# Patient Record
Sex: Male | Born: 1940 | Race: White | Marital: Married | Smoking: Former smoker
Health system: Southern US, Community
[De-identification: ages and names within clinical notes are randomized; demographics above are authoritative.]

## PROBLEM LIST (undated history)

## (undated) DIAGNOSIS — F329 Major depressive disorder, single episode, unspecified: Secondary | ICD-10-CM

## (undated) DIAGNOSIS — N183 Chronic kidney disease, stage 3 (moderate): Secondary | ICD-10-CM

## (undated) DIAGNOSIS — M5136 Other intervertebral disc degeneration, lumbar region: Secondary | ICD-10-CM

## (undated) DIAGNOSIS — Z794 Long term (current) use of insulin: Secondary | ICD-10-CM

## (undated) DIAGNOSIS — E11319 Type 2 diabetes mellitus with unspecified diabetic retinopathy without macular edema: Secondary | ICD-10-CM

## (undated) DIAGNOSIS — G4730 Sleep apnea, unspecified: Secondary | ICD-10-CM

## (undated) DIAGNOSIS — G2581 Restless legs syndrome: Secondary | ICD-10-CM

## (undated) DIAGNOSIS — I252 Old myocardial infarction: Secondary | ICD-10-CM

## (undated) DIAGNOSIS — N179 Acute kidney failure, unspecified: Secondary | ICD-10-CM

## (undated) DIAGNOSIS — G629 Polyneuropathy, unspecified: Secondary | ICD-10-CM

## (undated) DIAGNOSIS — E113213 Type 2 diabetes mellitus with mild nonproliferative diabetic retinopathy with macular edema, bilateral: Secondary | ICD-10-CM

## (undated) DIAGNOSIS — M1990 Unspecified osteoarthritis, unspecified site: Secondary | ICD-10-CM

## (undated) DIAGNOSIS — N189 Chronic kidney disease, unspecified: Secondary | ICD-10-CM

## (undated) DIAGNOSIS — E7800 Pure hypercholesterolemia, unspecified: Secondary | ICD-10-CM

## (undated) DIAGNOSIS — M51369 Other intervertebral disc degeneration, lumbar region without mention of lumbar back pain or lower extremity pain: Secondary | ICD-10-CM

## (undated) DIAGNOSIS — H26491 Other secondary cataract, right eye: Secondary | ICD-10-CM

## (undated) DIAGNOSIS — E669 Obesity, unspecified: Secondary | ICD-10-CM

## (undated) DIAGNOSIS — E11311 Type 2 diabetes mellitus with unspecified diabetic retinopathy with macular edema: Secondary | ICD-10-CM

## (undated) DIAGNOSIS — Z961 Presence of intraocular lens: Secondary | ICD-10-CM

## (undated) DIAGNOSIS — E119 Type 2 diabetes mellitus without complications: Secondary | ICD-10-CM

## (undated) DIAGNOSIS — I10 Essential (primary) hypertension: Secondary | ICD-10-CM

## (undated) DIAGNOSIS — M5126 Other intervertebral disc displacement, lumbar region: Secondary | ICD-10-CM

## (undated) DIAGNOSIS — D631 Anemia in chronic kidney disease: Secondary | ICD-10-CM

## (undated) DIAGNOSIS — E559 Vitamin D deficiency, unspecified: Secondary | ICD-10-CM

## (undated) DIAGNOSIS — K219 Gastro-esophageal reflux disease without esophagitis: Secondary | ICD-10-CM

## (undated) DIAGNOSIS — G4733 Obstructive sleep apnea (adult) (pediatric): Secondary | ICD-10-CM

## (undated) DIAGNOSIS — N289 Disorder of kidney and ureter, unspecified: Secondary | ICD-10-CM

## (undated) DIAGNOSIS — IMO0002 Reserved for concepts with insufficient information to code with codable children: Secondary | ICD-10-CM

## (undated) DIAGNOSIS — M4310 Spondylolisthesis, site unspecified: Secondary | ICD-10-CM

## (undated) DIAGNOSIS — M1120 Other chondrocalcinosis, unspecified site: Secondary | ICD-10-CM

## (undated) DIAGNOSIS — M25529 Pain in unspecified elbow: Secondary | ICD-10-CM

## (undated) DIAGNOSIS — J984 Other disorders of lung: Secondary | ICD-10-CM

## (undated) DIAGNOSIS — E1165 Type 2 diabetes mellitus with hyperglycemia: Secondary | ICD-10-CM

## (undated) DIAGNOSIS — F4321 Adjustment disorder with depressed mood: Secondary | ICD-10-CM

## (undated) DIAGNOSIS — R945 Abnormal results of liver function studies: Secondary | ICD-10-CM

## (undated) DIAGNOSIS — I2510 Atherosclerotic heart disease of native coronary artery without angina pectoris: Secondary | ICD-10-CM

## (undated) DIAGNOSIS — K8050 Calculus of bile duct without cholangitis or cholecystitis without obstruction: Secondary | ICD-10-CM

## (undated) DIAGNOSIS — I480 Paroxysmal atrial fibrillation: Secondary | ICD-10-CM

## (undated) DIAGNOSIS — K8000 Calculus of gallbladder with acute cholecystitis without obstruction: Secondary | ICD-10-CM

## (undated) DIAGNOSIS — E1122 Type 2 diabetes mellitus with diabetic chronic kidney disease: Secondary | ICD-10-CM

## (undated) DIAGNOSIS — I219 Acute myocardial infarction, unspecified: Secondary | ICD-10-CM

## (undated) DIAGNOSIS — M549 Dorsalgia, unspecified: Secondary | ICD-10-CM

## (undated) DIAGNOSIS — E1142 Type 2 diabetes mellitus with diabetic polyneuropathy: Secondary | ICD-10-CM

## (undated) DIAGNOSIS — I129 Hypertensive chronic kidney disease with stage 1 through stage 4 chronic kidney disease, or unspecified chronic kidney disease: Secondary | ICD-10-CM

## (undated) HISTORY — DX: Acute myocardial infarction, unspecified: I21.9

## (undated) HISTORY — DX: Atherosclerotic heart disease of native coronary artery without angina pectoris: I25.10

## (undated) HISTORY — DX: Morbid (severe) obesity due to excess calories: E66.01

## (undated) HISTORY — DX: Anemia in chronic kidney disease: D63.1

## (undated) HISTORY — PX: LUMBAR DISC SURGERY: SHX700

## (undated) HISTORY — DX: Hypertensive chronic kidney disease with stage 1 through stage 4 chronic kidney disease, or unspecified chronic kidney disease: I12.9

## (undated) HISTORY — DX: Type 2 diabetes mellitus with diabetic chronic kidney disease: E11.22

## (undated) HISTORY — DX: Calculus of gallbladder with acute cholecystitis without obstruction: K80.00

## (undated) HISTORY — DX: Presence of intraocular lens: Z96.1

## (undated) HISTORY — DX: Other chondrocalcinosis, unspecified site: M11.20

## (undated) HISTORY — DX: Restless legs syndrome: G25.81

## (undated) HISTORY — PX: CATARACT EXTRACTION, BILATERAL: SHX1313

## (undated) HISTORY — DX: Type 2 diabetes mellitus with mild nonproliferative diabetic retinopathy with macular edema, bilateral: E11.3213

## (undated) HISTORY — DX: Spondylolisthesis, site unspecified: M43.10

## (undated) HISTORY — PX: OTHER SURGICAL HISTORY: SHX169

## (undated) HISTORY — DX: Calculus of bile duct without cholangitis or cholecystitis without obstruction: K80.50

## (undated) HISTORY — DX: Other intervertebral disc displacement, lumbar region: M51.26

## (undated) HISTORY — DX: Abnormal results of liver function studies: R94.5

## (undated) HISTORY — DX: Old myocardial infarction: I25.2

## (undated) HISTORY — DX: Other disorders of lung: J98.4

## (undated) HISTORY — DX: Adjustment disorder with depressed mood: F43.21

## (undated) HISTORY — DX: Polyneuropathy, unspecified: G62.9

## (undated) HISTORY — DX: Type 2 diabetes mellitus with unspecified diabetic retinopathy without macular edema: E11.319

## (undated) HISTORY — DX: Type 2 diabetes mellitus with diabetic polyneuropathy: E11.42

## (undated) HISTORY — DX: Acute kidney failure, unspecified: N17.9

## (undated) HISTORY — DX: Major depressive disorder, single episode, unspecified: F32.9

## (undated) HISTORY — DX: Unspecified osteoarthritis, unspecified site: M19.90

## (undated) HISTORY — DX: Pain in unspecified elbow: M25.529

## (undated) HISTORY — DX: Sleep apnea, unspecified: G47.30

## (undated) HISTORY — DX: Pure hypercholesterolemia, unspecified: E78.00

## (undated) HISTORY — DX: Other secondary cataract, right eye: H26.491

## (undated) HISTORY — DX: Type 2 diabetes mellitus with unspecified diabetic retinopathy with macular edema: E11.311

## (undated) HISTORY — DX: Chronic kidney disease, unspecified: N18.9

## (undated) HISTORY — DX: Type 2 diabetes mellitus with hyperglycemia: E11.65

## (undated) HISTORY — DX: Long term (current) use of insulin: Z79.4

## (undated) HISTORY — DX: Reserved for concepts with insufficient information to code with codable children: IMO0002

## (undated) HISTORY — DX: Other intervertebral disc degeneration, lumbar region without mention of lumbar back pain or lower extremity pain: M51.369

## (undated) HISTORY — DX: Other intervertebral disc degeneration, lumbar region: M51.36

## (undated) HISTORY — DX: Obstructive sleep apnea (adult) (pediatric): G47.33

## (undated) HISTORY — DX: Chronic kidney disease, stage 3 (moderate): N18.3

## (undated) HISTORY — DX: Vitamin D deficiency, unspecified: E55.9

## (undated) HISTORY — DX: Paroxysmal atrial fibrillation: I48.0

---

## 1998-04-10 HISTORY — PX: CARDIAC SURGERY: SHX584

## 2012-08-01 DIAGNOSIS — E113213 Type 2 diabetes mellitus with mild nonproliferative diabetic retinopathy with macular edema, bilateral: Secondary | ICD-10-CM | POA: Insufficient documentation

## 2012-08-01 HISTORY — DX: Type 2 diabetes mellitus with mild nonproliferative diabetic retinopathy with macular edema, bilateral: E11.3213

## 2012-09-12 DIAGNOSIS — E11311 Type 2 diabetes mellitus with unspecified diabetic retinopathy with macular edema: Secondary | ICD-10-CM | POA: Insufficient documentation

## 2012-09-12 HISTORY — DX: Type 2 diabetes mellitus with unspecified diabetic retinopathy with macular edema: E11.311

## 2012-09-14 DIAGNOSIS — F32A Depression, unspecified: Secondary | ICD-10-CM

## 2012-09-14 DIAGNOSIS — IMO0002 Reserved for concepts with insufficient information to code with codable children: Secondary | ICD-10-CM | POA: Insufficient documentation

## 2012-09-14 DIAGNOSIS — M4310 Spondylolisthesis, site unspecified: Secondary | ICD-10-CM

## 2012-09-14 DIAGNOSIS — M5126 Other intervertebral disc displacement, lumbar region: Secondary | ICD-10-CM

## 2012-09-14 DIAGNOSIS — F329 Major depressive disorder, single episode, unspecified: Secondary | ICD-10-CM

## 2012-09-14 HISTORY — DX: Spondylolisthesis, site unspecified: M43.10

## 2012-09-14 HISTORY — DX: Other intervertebral disc displacement, lumbar region: M51.26

## 2012-09-14 HISTORY — DX: Major depressive disorder, single episode, unspecified: F32.9

## 2012-09-14 HISTORY — PX: KNEE SURGERY: SHX244

## 2012-09-14 HISTORY — DX: Morbid (severe) obesity due to excess calories: E66.01

## 2012-09-14 HISTORY — DX: Reserved for concepts with insufficient information to code with codable children: IMO0002

## 2012-09-14 HISTORY — DX: Depression, unspecified: F32.A

## 2013-03-03 DIAGNOSIS — F4321 Adjustment disorder with depressed mood: Secondary | ICD-10-CM

## 2013-03-03 HISTORY — DX: Adjustment disorder with depressed mood: F43.21

## 2013-05-15 DIAGNOSIS — Z961 Presence of intraocular lens: Secondary | ICD-10-CM

## 2013-05-15 HISTORY — DX: Presence of intraocular lens: Z96.1

## 2013-12-25 DIAGNOSIS — M1120 Other chondrocalcinosis, unspecified site: Secondary | ICD-10-CM | POA: Insufficient documentation

## 2013-12-25 HISTORY — DX: Other chondrocalcinosis, unspecified site: M11.20

## 2015-02-04 DIAGNOSIS — H26491 Other secondary cataract, right eye: Secondary | ICD-10-CM

## 2015-02-04 HISTORY — DX: Other secondary cataract, right eye: H26.491

## 2015-02-22 DIAGNOSIS — I2510 Atherosclerotic heart disease of native coronary artery without angina pectoris: Secondary | ICD-10-CM | POA: Insufficient documentation

## 2015-02-22 DIAGNOSIS — N189 Chronic kidney disease, unspecified: Secondary | ICD-10-CM

## 2015-02-22 DIAGNOSIS — G4733 Obstructive sleep apnea (adult) (pediatric): Secondary | ICD-10-CM

## 2015-02-22 DIAGNOSIS — I252 Old myocardial infarction: Secondary | ICD-10-CM

## 2015-02-22 HISTORY — DX: Atherosclerotic heart disease of native coronary artery without angina pectoris: I25.10

## 2015-02-22 HISTORY — DX: Old myocardial infarction: I25.2

## 2015-02-22 HISTORY — DX: Chronic kidney disease, unspecified: N18.9

## 2015-02-22 HISTORY — DX: Obstructive sleep apnea (adult) (pediatric): G47.33

## 2015-03-26 ENCOUNTER — Other Ambulatory Visit: Payer: Self-pay | Admitting: Pain Medicine

## 2015-03-26 DIAGNOSIS — M545 Low back pain: Secondary | ICD-10-CM

## 2015-04-13 ENCOUNTER — Ambulatory Visit
Admission: RE | Admit: 2015-04-13 | Discharge: 2015-04-13 | Disposition: A | Discharge status: Home / Self Care | Payer: Medicare Other | Source: Ambulatory Visit | Attending: Pain Medicine | Admitting: Pain Medicine

## 2015-04-13 DIAGNOSIS — M545 Low back pain: Secondary | ICD-10-CM

## 2015-06-14 DIAGNOSIS — E1122 Type 2 diabetes mellitus with diabetic chronic kidney disease: Secondary | ICD-10-CM | POA: Insufficient documentation

## 2015-06-14 DIAGNOSIS — I129 Hypertensive chronic kidney disease with stage 1 through stage 4 chronic kidney disease, or unspecified chronic kidney disease: Secondary | ICD-10-CM

## 2015-06-14 DIAGNOSIS — E559 Vitamin D deficiency, unspecified: Secondary | ICD-10-CM

## 2015-06-14 DIAGNOSIS — N183 Chronic kidney disease, stage 3 unspecified: Secondary | ICD-10-CM | POA: Insufficient documentation

## 2015-06-14 DIAGNOSIS — D631 Anemia in chronic kidney disease: Secondary | ICD-10-CM

## 2015-06-14 HISTORY — DX: Vitamin D deficiency, unspecified: E55.9

## 2015-06-14 HISTORY — DX: Chronic kidney disease, stage 3 unspecified: N18.30

## 2015-06-14 HISTORY — DX: Type 2 diabetes mellitus with diabetic chronic kidney disease: E11.22

## 2015-06-14 HISTORY — DX: Anemia in chronic kidney disease: D63.1

## 2015-06-14 HISTORY — DX: Hypertensive chronic kidney disease with stage 1 through stage 4 chronic kidney disease, or unspecified chronic kidney disease: I12.9

## 2015-08-12 DIAGNOSIS — Z794 Long term (current) use of insulin: Secondary | ICD-10-CM

## 2015-08-12 DIAGNOSIS — E1142 Type 2 diabetes mellitus with diabetic polyneuropathy: Secondary | ICD-10-CM | POA: Insufficient documentation

## 2015-08-12 DIAGNOSIS — E1165 Type 2 diabetes mellitus with hyperglycemia: Secondary | ICD-10-CM

## 2015-08-12 DIAGNOSIS — IMO0002 Reserved for concepts with insufficient information to code with codable children: Secondary | ICD-10-CM

## 2015-08-12 HISTORY — DX: Reserved for concepts with insufficient information to code with codable children: IMO0002

## 2015-08-12 HISTORY — DX: Type 2 diabetes mellitus with hyperglycemia: E11.65

## 2016-02-21 DIAGNOSIS — N179 Acute kidney failure, unspecified: Secondary | ICD-10-CM | POA: Insufficient documentation

## 2016-02-21 HISTORY — DX: Acute kidney failure, unspecified: N17.9

## 2016-06-02 DIAGNOSIS — J984 Other disorders of lung: Secondary | ICD-10-CM

## 2016-06-02 HISTORY — DX: Other disorders of lung: J98.4

## 2016-08-03 DIAGNOSIS — G2581 Restless legs syndrome: Secondary | ICD-10-CM | POA: Insufficient documentation

## 2016-08-03 HISTORY — DX: Restless legs syndrome: G25.81

## 2016-11-27 ENCOUNTER — Emergency Department (HOSPITAL_BASED_OUTPATIENT_CLINIC_OR_DEPARTMENT_OTHER): Payer: Medicare Other

## 2016-11-27 ENCOUNTER — Emergency Department (HOSPITAL_BASED_OUTPATIENT_CLINIC_OR_DEPARTMENT_OTHER)
Admission: EM | Admit: 2016-11-27 | Discharge: 2016-11-28 | Disposition: A | Discharge status: Other Acute Inpatient Hospital | Payer: Medicare Other | Attending: Emergency Medicine | Admitting: Emergency Medicine

## 2016-11-27 ENCOUNTER — Encounter (HOSPITAL_BASED_OUTPATIENT_CLINIC_OR_DEPARTMENT_OTHER): Payer: Self-pay | Admitting: Emergency Medicine

## 2016-11-27 DIAGNOSIS — R197 Diarrhea, unspecified: Secondary | ICD-10-CM | POA: Diagnosis not present

## 2016-11-27 DIAGNOSIS — Z7982 Long term (current) use of aspirin: Secondary | ICD-10-CM | POA: Insufficient documentation

## 2016-11-27 DIAGNOSIS — R7989 Other specified abnormal findings of blood chemistry: Secondary | ICD-10-CM | POA: Insufficient documentation

## 2016-11-27 DIAGNOSIS — I10 Essential (primary) hypertension: Secondary | ICD-10-CM | POA: Diagnosis not present

## 2016-11-27 DIAGNOSIS — E119 Type 2 diabetes mellitus without complications: Secondary | ICD-10-CM | POA: Diagnosis not present

## 2016-11-27 DIAGNOSIS — I4891 Unspecified atrial fibrillation: Secondary | ICD-10-CM | POA: Diagnosis not present

## 2016-11-27 DIAGNOSIS — Z794 Long term (current) use of insulin: Secondary | ICD-10-CM | POA: Diagnosis not present

## 2016-11-27 DIAGNOSIS — R945 Abnormal results of liver function studies: Secondary | ICD-10-CM | POA: Diagnosis not present

## 2016-11-27 DIAGNOSIS — R1110 Vomiting, unspecified: Secondary | ICD-10-CM | POA: Diagnosis present

## 2016-11-27 DIAGNOSIS — Z87891 Personal history of nicotine dependence: Secondary | ICD-10-CM | POA: Insufficient documentation

## 2016-11-27 DIAGNOSIS — R112 Nausea with vomiting, unspecified: Secondary | ICD-10-CM | POA: Insufficient documentation

## 2016-11-27 DIAGNOSIS — R778 Other specified abnormalities of plasma proteins: Secondary | ICD-10-CM

## 2016-11-27 HISTORY — DX: Pure hypercholesterolemia, unspecified: E78.00

## 2016-11-27 HISTORY — DX: Essential (primary) hypertension: I10

## 2016-11-27 HISTORY — DX: Disorder of kidney and ureter, unspecified: N28.9

## 2016-11-27 HISTORY — DX: Type 2 diabetes mellitus without complications: E11.9

## 2016-11-27 HISTORY — DX: Obesity, unspecified: E66.9

## 2016-11-27 HISTORY — DX: Gastro-esophageal reflux disease without esophagitis: K21.9

## 2016-11-27 HISTORY — DX: Dorsalgia, unspecified: M54.9

## 2016-11-27 LAB — CBC WITH DIFFERENTIAL/PLATELET
Basophils Absolute: 0 10*3/uL (ref 0.0–0.1)
Basophils Relative: 0 %
Eosinophils Absolute: 0 10*3/uL (ref 0.0–0.7)
Eosinophils Relative: 1 %
HCT: 29.1 % — ABNORMAL LOW (ref 39.0–52.0)
Hemoglobin: 9.7 g/dL — ABNORMAL LOW (ref 13.0–17.0)
Lymphocytes Relative: 16 %
Lymphs Abs: 0.6 10*3/uL — ABNORMAL LOW (ref 0.7–4.0)
MCH: 29 pg (ref 26.0–34.0)
MCHC: 33.3 g/dL (ref 30.0–36.0)
MCV: 87.1 fL (ref 78.0–100.0)
Monocytes Absolute: 0.3 10*3/uL (ref 0.1–1.0)
Monocytes Relative: 8 %
Neutro Abs: 2.8 10*3/uL (ref 1.7–7.7)
Neutrophils Relative %: 75 %
Platelets: 95 10*3/uL — ABNORMAL LOW (ref 150–400)
RBC: 3.34 MIL/uL — ABNORMAL LOW (ref 4.22–5.81)
RDW: 14 % (ref 11.5–15.5)
Smear Review: DECREASED
WBC: 3.7 10*3/uL — ABNORMAL LOW (ref 4.0–10.5)

## 2016-11-27 LAB — COMPREHENSIVE METABOLIC PANEL
ALBUMIN: 3.4 g/dL — AB (ref 3.5–5.0)
ALT: 113 U/L — AB (ref 17–63)
AST: 108 U/L — AB (ref 15–41)
Alkaline Phosphatase: 254 U/L — ABNORMAL HIGH (ref 38–126)
Anion gap: 12 (ref 5–15)
BUN: 40 mg/dL — AB (ref 6–20)
CHLORIDE: 103 mmol/L (ref 101–111)
CO2: 19 mmol/L — ABNORMAL LOW (ref 22–32)
CREATININE: 2.09 mg/dL — AB (ref 0.61–1.24)
Calcium: 8.5 mg/dL — ABNORMAL LOW (ref 8.9–10.3)
GFR calc Af Amer: 33 mL/min — ABNORMAL LOW (ref >60)
GFR, EST NON AFRICAN AMERICAN: 29 mL/min — AB (ref >60)
GLUCOSE: 487 mg/dL — AB (ref 65–99)
POTASSIUM: 4.8 mmol/L (ref 3.5–5.1)
Sodium: 134 mmol/L — ABNORMAL LOW (ref 135–145)
Total Bilirubin: 4.5 mg/dL — ABNORMAL HIGH (ref 0.3–1.2)
Total Protein: 6.5 g/dL (ref 6.5–8.1)

## 2016-11-27 LAB — URINALYSIS, ROUTINE W REFLEX MICROSCOPIC
HGB URINE DIPSTICK: NEGATIVE
Ketones, ur: NEGATIVE mg/dL
Leukocytes, UA: NEGATIVE
Nitrite: NEGATIVE
PH: 5 (ref 5.0–8.0)
Protein, ur: NEGATIVE mg/dL
SPECIFIC GRAVITY, URINE: 1.016 (ref 1.005–1.030)

## 2016-11-27 LAB — LIPASE, BLOOD: LIPASE: 26 U/L (ref 11–51)

## 2016-11-27 LAB — URINALYSIS, MICROSCOPIC (REFLEX): RBC / HPF: NONE SEEN RBC/hpf (ref 0–5)

## 2016-11-27 LAB — TROPONIN I: Troponin I: 0.06 ng/mL (ref <0.03)

## 2016-11-27 MED ORDER — METRONIDAZOLE IN NACL 5-0.79 MG/ML-% IV SOLN
500.0000 mg | Freq: Once | INTRAVENOUS | Status: AC
  Administered 2016-11-27: 500 mg via INTRAVENOUS
  Filled 2016-11-27: qty 100

## 2016-11-27 MED ORDER — SODIUM CHLORIDE 0.9 % IV BOLUS (SEPSIS)
1000.0000 mL | Freq: Once | INTRAVENOUS | Status: AC
  Administered 2016-11-27: 1000 mL via INTRAVENOUS

## 2016-11-27 MED ORDER — MORPHINE SULFATE (PF) 4 MG/ML IV SOLN
4.0000 mg | Freq: Once | INTRAVENOUS | Status: AC
  Administered 2016-11-27: 4 mg via INTRAVENOUS
  Filled 2016-11-27: qty 1

## 2016-11-27 MED ORDER — ONDANSETRON HCL 4 MG/2ML IJ SOLN
4.0000 mg | Freq: Once | INTRAMUSCULAR | Status: AC
  Administered 2016-11-27: 4 mg via INTRAVENOUS
  Filled 2016-11-27: qty 2

## 2016-11-27 MED ORDER — CIPROFLOXACIN IN D5W 400 MG/200ML IV SOLN
400.0000 mg | Freq: Once | INTRAVENOUS | Status: AC
  Administered 2016-11-28: 400 mg via INTRAVENOUS
  Filled 2016-11-27: qty 200

## 2016-11-27 NOTE — ED Notes (Signed)
ED Provider at bedside. 

## 2016-11-27 NOTE — ED Notes (Signed)
Patient transported to CT 

## 2016-11-27 NOTE — ED Notes (Signed)
Notified EDP and RN of elevated troponin

## 2016-11-27 NOTE — ED Triage Notes (Signed)
Abdominal cramps, N/V/D x3 days.

## 2016-11-27 NOTE — ED Provider Notes (Signed)
[facility] Note   CSN: [id number] Arrival date & time: 11/27/16  2007  By signing my name below, I, Deland Pretty, attest that this documentation has been prepared under the direction and in the presence of Charlynne Pander, MD. Electronically Signed: Deland Pretty, ED Scribe. 11/27/16. 8:56 PM.  History   Chief Complaint Chief Complaint  Patient presents with  . Emesis   The history is provided by the patient and the spouse. No language interpreter was used.   HPI Comments: Eric Huerta is a 76 y.o. male who presents to the Emergency Department complaining of gradually worsening, lower "cramping" abdominal pain with associated nausea, and multiple episodes of emesis, and diarrhea that began 3 days ago. The pt states that he has had 10-15 episodes of emesis. He additionally reports that episodes of diarrhea occur around 3-4X per hour. He reports a h/x of C-DIFF that occurred 2-3 years ago. He denies fever.  Past Medical History:  Diagnosis Date  . Back pain   . Diabetes mellitus without complication (HCC)   . GERD (gastroesophageal reflux disease)   . High cholesterol   . Hypertension   . Obesity   . Renal disorder     There are no active problems to display for this patient.   History reviewed. No pertinent surgical history.     Home Medications    Prior to Admission medications   Medication Sig Start Date End Date Taking? Authorizing Provider  aspirin 81 MG chewable tablet Chew 81 mg by mouth daily.   Yes [provider]  carvedilol (COREG) 6.25 MG tablet Take 6.25 mg by mouth 2 (two) times daily with a meal.   Yes [provider]  clotrimazole-betamethasone (LOTRISONE) cream Apply 1 application topically 2 (two) times daily.   Yes [provider]  diclofenac sodium (VOLTAREN) 1 % GEL Apply topically 2 (two) times daily.   Yes [provider]  docusate sodium (COLACE) 100 MG capsule Take 100 mg by  mouth daily as needed for mild constipation.   Yes [provider]  doxazosin (CARDURA) 2 MG tablet Take 2 mg by mouth daily.   Yes [provider]  enalapril (VASOTEC) 20 MG tablet Take 20 mg by mouth 2 (two) times daily.   Yes [provider]  fentaNYL (DURAGESIC - DOSED MCG/HR) 25 MCG/HR patch Place 25 mcg onto the skin every 3 (three) days.   Yes [provider]  furosemide (LASIX) 20 MG tablet Take 20 mg by mouth daily. Per edp stop taking for one week starting 11/27/16   Yes [provider]  hydrALAZINE (APRESOLINE) 25 MG tablet Take 25 mg by mouth 3 (three) times daily.   Yes [provider]  HYDROcodone-acetaminophen (NORCO) 10-325 MG tablet Take 1 tablet by mouth 2 (two) times daily as needed.   Yes [provider]  insulin regular (NOVOLIN R,HUMULIN R) 100 units/mL injection Inject into the skin 3 (three) times daily before meals. Sliding scale   Yes [provider]  nystatin (MYCOSTATIN/NYSTOP) powder Apply topically 2 (two) times daily.   Yes [provider]  omeprazole (PRILOSEC) 20 MG capsule Take 20 mg by mouth 2 (two) times daily.   Yes [provider]  prochlorperazine (COMPAZINE) 5 MG tablet Take 5 mg by mouth every 6 (six) hours as needed for nausea or vomiting.   Yes [provider]  rosuvastatin (CRESTOR) 40 MG tablet Take 40 mg by mouth daily.   Yes [provider]  venlafaxine (EFFEXOR) 37.5 MG tablet Take 37.5 mg by mouth 2 (two) times daily with a meal.   Yes [provider]    Family History No family history on file.  Social History Social History  Substance Use Topics  . Smoking status: Former Games developer  . Smokeless tobacco: Never Used  . Alcohol use No     Allergies   Lyrica [pregabalin]; Norvasc [amlodipine besylate]; and Penicillins   Review of Systems Review of Systems  Constitutional: Negative for fever.  Gastrointestinal: Positive for  abdominal pain, diarrhea, nausea and vomiting.  All other systems reviewed and are negative.    Physical Exam Updated Vital Signs BP (!) 173/74 (BP Location: Right Arm) Comment: Pt could not take 2nd and 3rd bp pills today due to vomiting.  Pulse 62   Temp 98.2 F (36.8 C) (Oral)   Resp 14   Ht 5\' 7"  (1.702 m)   Wt 108 kg (238 lb)   SpO2 97%   BMI 37.28 kg/m   Physical Exam  Constitutional: He is oriented to person, place, and time. He appears well-developed and well-nourished.  Appears dehydrated.   HENT:  Head: Normocephalic.  Mucous membranes are dry.  Eyes: EOM are normal.  Neck: Normal range of motion.  Pulmonary/Chest: Effort normal.  Abdominal: He exhibits no distension. There is tenderness.  Mild left lower quadrant tenderness.  Musculoskeletal: Normal range of motion.  Neurological: He is alert and oriented to person, place, and time.  Psychiatric: He has a normal mood and affect.  Nursing note and vitals reviewed.    ED Treatments / Results   DIAGNOSTIC STUDIES: Oxygen Saturation is 98% on RA, normal by my interpretation.   COORDINATION OF CARE: 8:50 PM-Discussed next steps with pt. Pt verbalized understanding and is agreeable with the plan.   Labs (all labs ordered are listed, but only abnormal results are displayed) Labs Reviewed  CBC WITH DIFFERENTIAL/PLATELET - Abnormal; Notable for the following:       Result Value   WBC 3.7 (*)    RBC 3.34 (*)    Hemoglobin 9.7 (*)    HCT 29.1 (*)    Platelets 95 (*)    Lymphs Abs 0.6 (*)    All other components within normal limits  COMPREHENSIVE METABOLIC PANEL - Abnormal; Notable for the following:    Sodium 134 (*)    CO2 19 (*)    Glucose, Bld 487 (*)    BUN 40 (*)    Creatinine, Ser 2.09 (*)    Calcium 8.5 (*)    Albumin 3.4 (*)    AST 108 (*)    ALT 113 (*)    Alkaline Phosphatase 254 (*)    Total Bilirubin 4.5 (*)    GFR calc non Af Amer 29 (*)    GFR calc Af Amer 33 (*)    All other  components within normal limits  TROPONIN I - Abnormal; Notable for the following:    Troponin I 0.06 (*)    All other components within normal limits  URINALYSIS, ROUTINE W REFLEX MICROSCOPIC - Abnormal; Notable for the following:    Color, Urine AMBER (*)    Glucose, UA >=500 (*)    Bilirubin Urine SMALL (*)    All other components within normal limits  URINALYSIS, MICROSCOPIC (REFLEX) - Abnormal; Notable for the following:    Bacteria, UA RARE (*)    Squamous Epithelial / LPF 0-5 (*)    All other components within normal limits  C DIFFICILE QUICK SCREEN W PCR REFLEX  LIPASE, BLOOD  BRAIN NATRIURETIC PEPTIDE    EKG  EKG Interpretation  Date/Time:  Monday November 27 2016 20:45:57 EDT Ventricular Rate:  63 PR Interval:    QRS Duration: 153 QT Interval:  467 QTC Calculation: 479 R Axis:   -65 Text Interpretation:  Atrial fibrillation Right bundle branch block LVH with IVCD and secondary repol abnrm Borderline prolonged QT interval No previous ECGs available Confirmed by Richardean Canal [id number]) on 11/27/2016 8:53:48 PM       Radiology Ct Abdomen Pelvis Wo Contrast  Result Date: 11/27/2016 CLINICAL DATA:  Diarrhea and vomiting EXAM: CT ABDOMEN AND PELVIS WITHOUT CONTRAST TECHNIQUE: Multidetector CT imaging of the abdomen and pelvis was performed following the standard protocol without IV contrast. COMPARISON:  None. FINDINGS: Lower chest: No pulmonary nodules or pleural effusion. No visible pericardial effusion. There are coronary artery calcifications. Hepatobiliary: Borderline hepatic steatosis. No biliary dilatation. The gallbladder is nondistended and contains hyperdense material. Pancreas: Normal contours without ductal dilatation. No peripancreatic fluid collection. Spleen: Normal. Adrenals/Urinary Tract: --Adrenal glands: Linear right adrenal gland is consistent with pelvic positioning of right kidney. --Right kidney/ureter: Pelvic right kidney.  No hydronephrosis. --Left  kidney/ureter: No hydronephrosis or perinephric stranding. No nephrolithiasis. No obstructing ureteral stones. --Urinary bladder: Unremarkable. Stomach/Bowel: --Stomach/Duodenum: No hiatal hernia or other gastric abnormality. Normal duodenal course and caliber. --Small bowel: No dilatation or inflammation. --Colon: No focal abnormality. --Appendix: Normal. Vascular/Lymphatic: Atherosclerotic calcification is present within the non-aneurysmal abdominal aorta, without hemodynamically significant stenosis. No abdominal or pelvic lymphadenopathy. Reproductive: Normal prostate and seminal vesicles. Musculoskeletal. 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 Other: None. IMPRESSION: 1. Nondistended gallbladder with hyperdense contents. This may indicate cholelithiasis, but is indeterminate. Other considerations would include refluxed enteric contrast if the patient has a history of prior biliary intervention. The Right upper quadrant ultrasound is recommended for further characterization. 2. No colonic or enteric acute inflammation. 3. 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 5. Coronary artery and Aortic Atherosclerosis (ICD10-I70.0). Electronically Signed   By: Deatra Robinson M.D.   On: 11/27/2016 22:57   Dg Chest Port 1 View  Result Date: 11/27/2016 CLINICAL DATA:  Vomiting, elevated troponin. EXAM: PORTABLE CHEST 1 VIEW COMPARISON:  None. FINDINGS: Cardiac silhouette is moderately enlarged, mediastinal silhouette is unremarkable. Pulmonary vascular congestion and mild interstitial prominence. No pleural effusion or focal consolidation. No pneumothorax. Soft tissue planes and included osseous structures are nonsuspicious. IMPRESSION: Moderate cardiomegaly. Interstitial prominence most compatible with pulmonary edema. Electronically Signed   By: Awilda Metro M.D.   On: 11/27/2016 22:25     Procedures Procedures (including critical care time)  Medications Ordered in ED Medications  ciprofloxacin (CIPRO) IVPB 400 mg (not administered)  metroNIDAZOLE (FLAGYL) IVPB 500 mg (500 mg Intravenous New Bag/Given 11/27/16 2316)  sodium chloride 0.9 % bolus 1,000 mL (0 mLs Intravenous Stopped 11/27/16 2142)  ondansetron (ZOFRAN) injection 4 mg (4 mg Intravenous Given 11/27/16 2050)  morphine 4 MG/ML injection 4 mg (4 mg Intravenous Given 11/27/16 2100)     Initial Impression / Assessment and Plan / ED Course  I have reviewed the triage vital signs and the nursing notes.  Pertinent labs & imaging results that were available during my care of the patient were reviewed by me and considered in my medical decision making (see chart for details).     Eric Huerta is a 76 y.o. male here  with vomiting, diarrhea. Patient has a history of C. Difficile. Patient appears dehydrated and has LLQ tenderness. Also has new onset afib. Will get labs, C diff, CT ab/pel, trop.   10 pm WBC slightly low at 3.7. CMP showed Cr 2.1, stable, AST and ALT elevated and bili 4.5. CT ab/pel showed nondistended gallbladder with possible cholelithiasis. No obvious enteritis. Still unable to tolerate PO. Trop 0.06, CXR showed pulmonary edema. Given multiple issues (elevated trop, elevated LFTs, persistent vomiting, pulmonary edema), will admit to [facility] for workup. Dr. Clint Guy accepting doctor.    Final Clinical Impressions(s) / ED Diagnoses   Final diagnoses:  Elevated LFTs  Nausea vomiting and diarrhea  Atrial fibrillation, unspecified type (HCC)  Elevated troponin    New Prescriptions New Prescriptions   No medications on file   I personally performed the services described in this documentation, which was scribed in my presence. The recorded information has been reviewed and is accurate.     Charlynne Pander, MD 11/28/16 [id number]

## 2016-11-28 DIAGNOSIS — I480 Paroxysmal atrial fibrillation: Secondary | ICD-10-CM

## 2016-11-28 DIAGNOSIS — E119 Type 2 diabetes mellitus without complications: Secondary | ICD-10-CM | POA: Diagnosis not present

## 2016-11-28 DIAGNOSIS — Z794 Long term (current) use of insulin: Secondary | ICD-10-CM | POA: Diagnosis not present

## 2016-11-28 DIAGNOSIS — Z87891 Personal history of nicotine dependence: Secondary | ICD-10-CM | POA: Diagnosis not present

## 2016-11-28 DIAGNOSIS — R945 Abnormal results of liver function studies: Secondary | ICD-10-CM

## 2016-11-28 DIAGNOSIS — R7989 Other specified abnormal findings of blood chemistry: Secondary | ICD-10-CM | POA: Diagnosis not present

## 2016-11-28 DIAGNOSIS — I4891 Unspecified atrial fibrillation: Secondary | ICD-10-CM | POA: Diagnosis not present

## 2016-11-28 DIAGNOSIS — Z7982 Long term (current) use of aspirin: Secondary | ICD-10-CM | POA: Diagnosis not present

## 2016-11-28 DIAGNOSIS — I10 Essential (primary) hypertension: Secondary | ICD-10-CM | POA: Diagnosis not present

## 2016-11-28 DIAGNOSIS — R197 Diarrhea, unspecified: Secondary | ICD-10-CM | POA: Diagnosis not present

## 2016-11-28 DIAGNOSIS — R112 Nausea with vomiting, unspecified: Secondary | ICD-10-CM | POA: Diagnosis not present

## 2016-11-28 DIAGNOSIS — R1110 Vomiting, unspecified: Secondary | ICD-10-CM | POA: Diagnosis present

## 2016-11-28 HISTORY — DX: Abnormal results of liver function studies: R94.5

## 2016-11-28 HISTORY — DX: Paroxysmal atrial fibrillation: I48.0

## 2016-11-28 HISTORY — DX: Other specified abnormal findings of blood chemistry: R79.89

## 2016-11-28 LAB — BRAIN NATRIURETIC PEPTIDE: B Natriuretic Peptide: 207.7 pg/mL — ABNORMAL HIGH (ref 0.0–100.0)

## 2016-11-28 NOTE — ED Notes (Signed)
Patient transported to Ultrasound 

## 2016-12-01 HISTORY — PX: LAPAROSCOPIC CHOLECYSTECTOMY: SUR755

## 2016-12-09 HISTORY — PX: GALLBLADDER SURGERY: SHX652

## 2016-12-12 ENCOUNTER — Encounter: Payer: Self-pay | Admitting: Internal Medicine

## 2016-12-12 ENCOUNTER — Non-Acute Institutional Stay (SKILLED_NURSING_FACILITY): Payer: Medicare Other | Admitting: Internal Medicine

## 2016-12-12 DIAGNOSIS — E785 Hyperlipidemia, unspecified: Secondary | ICD-10-CM

## 2016-12-12 DIAGNOSIS — E872 Acidosis: Secondary | ICD-10-CM

## 2016-12-12 DIAGNOSIS — E1165 Type 2 diabetes mellitus with hyperglycemia: Secondary | ICD-10-CM

## 2016-12-12 DIAGNOSIS — N183 Chronic kidney disease, stage 3 unspecified: Secondary | ICD-10-CM

## 2016-12-12 DIAGNOSIS — E1169 Type 2 diabetes mellitus with other specified complication: Secondary | ICD-10-CM | POA: Diagnosis not present

## 2016-12-12 DIAGNOSIS — E8729 Other acidosis: Secondary | ICD-10-CM

## 2016-12-12 DIAGNOSIS — R338 Other retention of urine: Secondary | ICD-10-CM

## 2016-12-12 DIAGNOSIS — E113213 Type 2 diabetes mellitus with mild nonproliferative diabetic retinopathy with macular edema, bilateral: Secondary | ICD-10-CM

## 2016-12-12 DIAGNOSIS — K8000 Calculus of gallbladder with acute cholecystitis without obstruction: Secondary | ICD-10-CM

## 2016-12-12 DIAGNOSIS — I129 Hypertensive chronic kidney disease with stage 1 through stage 4 chronic kidney disease, or unspecified chronic kidney disease: Secondary | ICD-10-CM | POA: Diagnosis not present

## 2016-12-12 DIAGNOSIS — D61818 Other pancytopenia: Secondary | ICD-10-CM

## 2016-12-12 DIAGNOSIS — G4733 Obstructive sleep apnea (adult) (pediatric): Secondary | ICD-10-CM | POA: Diagnosis not present

## 2016-12-12 DIAGNOSIS — N179 Acute kidney failure, unspecified: Secondary | ICD-10-CM

## 2016-12-12 DIAGNOSIS — E1142 Type 2 diabetes mellitus with diabetic polyneuropathy: Secondary | ICD-10-CM | POA: Diagnosis not present

## 2016-12-12 DIAGNOSIS — D631 Anemia in chronic kidney disease: Secondary | ICD-10-CM

## 2016-12-12 DIAGNOSIS — IMO0002 Reserved for concepts with insufficient information to code with codable children: Secondary | ICD-10-CM

## 2016-12-12 DIAGNOSIS — F32A Depression, unspecified: Secondary | ICD-10-CM

## 2016-12-12 DIAGNOSIS — Z794 Long term (current) use of insulin: Secondary | ICD-10-CM

## 2016-12-12 DIAGNOSIS — F329 Major depressive disorder, single episode, unspecified: Secondary | ICD-10-CM

## 2016-12-12 NOTE — Progress Notes (Signed)
: Provider:  Randon Goldsmith. Lyn Hollingshead, MD Location:  [facility] Room Number: 109 Place of Service:  SNF ([phone])  PCP: Andreas Blower., MD Patient Care Team: Andreas Blower., MD as PCP - General (Internal Medicine)  Extended Emergency Contact Information Primary Emergency Contact: Lope,Jean Address: [address]           [place], [place] [address] [place] Home Phone: [phone] Relation: Spouse     Allergies: Lyrica [pregabalin]; Norvasc [amlodipine besylate]; and Penicillins  Chief Complaint  Patient presents with  . New Admit To SNF    following hospitalization 11/30/16 to 12/08/16 choledocholithiasis and acute cholecystitis.     HPI: Patient is 76 y.o. male with insulin-dependent diabetes mellitus, peripheral neuropathy, hypertension, chronic kidney disease stage III, and arthritis, who was transferred from an outside hospital to [facility] on 11/30/2016 with known choledocholithiasis and acute cholecystitis. Patient was admitted to [facility] from 8/23-31 where he underwent ERCP on 823 which GI and laparoscopic cholecystectomy on 8/24 with surgery. Hospital course was complicated by acute on chronic renal failure post surgery and urinary retention which improved with IV fluids and a Foley pack catheter respectively. Patient passed a voiding trial prior to discharge. Patient will be admitted to skilled nursing facility with generalized weakness for OT/PT. While at skilled nursing facility patient will be followed for pancytopenia, followed with serial CBC, OSA treated with CPAP, and hypertension treated with hydralazine and Coreg Lasix and enalapril.  Past Medical History:  Diagnosis Date  . Abnormal LFTs 11/28/2016  . Acquired spondylolisthesis 09/14/2012  . Adjustment disorder with depressed mood 03/03/2013  . AKI (acute kidney injury) (HCC) 02/21/2016  . Anemia due to stage 3 chronic kidney disease 06/14/2015  . Back pain   .  Benign hypertension with chronic kidney disease, stage III 06/14/2015  . CAD (coronary artery disease) 02/22/2015  . Chronic kidney disease 02/22/2015  . Depressive disorder 09/14/2012  . Diabetes mellitus with stage 3 chronic kidney disease (HCC) 06/14/2015  . Diabetes mellitus without complication (HCC)   . Diabetic macular edema (HCC) 09/12/2012  . 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 Displacement of lumbar intervertebral disc 09/14/2012  . GERD (gastroesophageal reflux disease)   . High cholesterol   . Hypertension   . Morbid obesity (HCC) 09/14/2012  . Obesity   . Obstructive sleep apnea 02/22/2015  . Old myocardial infarction 02/22/2015  . Paroxysmal atrial fibrillation (HCC) 11/28/2016  . Pseudogout 12/25/2013   Overview:  Based on Right olecranon bursa synovial fluid analysis  . Renal disorder   . Restrictive lung disease 06/02/2016  . Right posterior capsular opacification 02/04/2015  . RLS (restless legs syndrome) 08/03/2016  . Status post intraocular lens implant 05/15/2013  . Tear of medial cartilage or meniscus of knee, current 09/14/2012  . Uncontrolled type 2 diabetes mellitus with diabetic polyneuropathy, with long-term current use of insulin (HCC) 08/12/2015  . Vitamin D deficiency 06/14/2015    Past Surgical History:  Procedure Laterality Date  . CARDIAC SURGERY  2000   stents placed in heart  . KNEE SURGERY Left 09/14/2012  . LUMBAR DISC SURGERY      Allergies as of 12/12/2016      Reactions   Lyrica [pregabalin]    Norvasc [amlodipine Besylate]    Penicillins       Medication List       Accurate as of  12/12/16  2:24 PM. Always use your most recent med list.          aspirin 81 MG chewable tablet Chew 81 mg by mouth daily.   carvedilol 6.25 MG tablet Commonly known as:  COREG Take 6.25 mg by mouth 2 (two) times daily with a meal.   CoQ10 100 MG Caps Take by mouth. Take  one capsule daily   docusate sodium 100 MG capsule Commonly known as:  COLACE Take 100 mg by mouth. Take one capsul 3 times daily. Stop taking if you have diarrhea   doxazosin 2 MG tablet Commonly known as:  CARDURA Take 2 mg by mouth daily.   enalapril 20 MG tablet Commonly known as:  VASOTEC Take 20 mg by mouth 2 (two) times daily.   fentaNYL 25 MCG/HR patch Commonly known as:  DURAGESIC - dosed mcg/hr Place 25 mcg onto the skin every 3 (three) days.   furosemide 20 MG tablet Commonly known as:  LASIX Take 20 mg by mouth. Take one tablet daily   hydrALAZINE 25 MG tablet Commonly known as:  APRESOLINE Take 25 mg by mouth 3 (three) times daily.   HYDROcodone-acetaminophen 10-325 MG tablet Commonly known as:  NORCO Take 1 tablet by mouth 2 (two) times daily as needed.   LANTUS 100 UNIT/ML injection Generic drug:  insulin glargine Inject 15 Units into the skin. Inject 15 units nightly   Lidocaine & Menthol 4-4 % Liqd Apply topically. Apply 1-3 patches topically daily as needed   magnesium oxide 400 MG tablet Commonly known as:  MAG-OX Take 400 mg by mouth. Take 2 tablets three times daily   mirtazapine 30 MG tablet Commonly known as:  REMERON Take 30 mg by mouth. Take one tablet nightly   nitroGLYCERIN 0.4 MG SL tablet Commonly known as:  NITROSTAT Place 0.4 mg under the tongue. Place one SL tablet under the tongue every 5 minutes as needed for chest pain for 3 doses   nystatin powder Commonly known as:  MYCOSTATIN/NYSTOP Apply topically 2 (two) times daily.   omeprazole 20 MG capsule Commonly known as:  PRILOSEC Take 20 mg by mouth 2 (two) times daily.   polyvinyl alcohol 1.4 % ophthalmic solution Commonly known as:  LIQUIFILM TEARS Place 1 drop into both eyes as needed for dry eyes.   rosuvastatin 40 MG tablet Commonly known as:  CRESTOR Take 40 mg by mouth daily.   senna 8.6 MG tablet Commonly known as:  SENOKOT Take 1 tablet by mouth. Take 2  tablets 2 times daily. Stop taking if you have diarrhea.   sodium bicarbonate 650 MG tablet Take 650 mg by mouth. Take one tablet twice daily   traZODone 50 MG tablet Commonly known as:  DESYREL Take 50 mg by mouth. Take 1/2 tablet (25 mg) nightly   triamcinolone 0.025 % cream Commonly known as:  KENALOG Apply 1 application topically. Apply on rash twice a day for 14 days   venlafaxine 37.5 MG tablet Commonly known as:  EFFEXOR Take 37.5 mg by mouth 2 (two) times daily with a meal.   Vitamin D (Ergocalciferol) 2000 units Caps Take 2,000 Units by mouth. Take one capsule daily       Meds ordered this encounter  Medications  . insulin glargine (LANTUS) 100 UNIT/ML injection    Sig: Inject 15 Units into the skin. Inject 15 units nightly  . magnesium oxide (MAG-OX) 400 MG tablet    Sig: Take 400 mg by mouth. Take 2 tablets  three times daily  . sodium bicarbonate 650 MG tablet    Sig: Take 650 mg by mouth. Take one tablet twice daily  . traZODone (DESYREL) 50 MG tablet    Sig: Take 50 mg by mouth. Take 1/2 tablet (25 mg) nightly  . triamcinolone (KENALOG) 0.025 % cream    Sig: Apply 1 application topically. Apply on rash twice a day for 14 days  . senna (SENOKOT) 8.6 MG tablet    Sig: Take 1 tablet by mouth. Take 2 tablets 2 times daily. Stop taking if you have diarrhea.  . Vitamin D, Ergocalciferol, 2000 units CAPS    Sig: Take 2,000 Units by mouth. Take one capsule daily  . Coenzyme Q10 (COQ10) 100 MG CAPS    Sig: Take by mouth. Take one capsule daily  . Lidocaine & Menthol 4-4 % LIQD    Sig: Apply topically. Apply 1-3 patches topically daily as needed  . mirtazapine (REMERON) 30 MG tablet    Sig: Take 30 mg by mouth. Take one tablet nightly  . nitroGLYCERIN (NITROSTAT) 0.4 MG SL tablet    Sig: Place 0.4 mg under the tongue. Place one SL tablet under the tongue every 5 minutes as needed for chest pain for 3 doses  . polyvinyl alcohol (LIQUIFILM TEARS) 1.4 % ophthalmic  solution    Sig: Place 1 drop into both eyes as needed for dry eyes.    Immunization History  Administered Date(s) Administered  . Influenza, High Dose Seasonal PF 01/05/2016  . Pneumococcal Conjugate-13 06/06/2013  . Pneumococcal Polysaccharide-23 02/13/2007  . Tdap 03/07/2010  . Zoster 09/17/2007    Social History  Substance Use Topics  . Smoking status: Former Smoker    Quit date: 09/28/2016  . Smokeless tobacco: Never Used  . Alcohol use No    Family history is   Family History  Problem Relation Age of Onset  . Diabetes Mother   . Hypertension Mother   . Hypertension Father   . Stroke Father   . Cancer Sister        bowel  . Diabetes Maternal Grandmother       Review of Systems  DATA OBTAINED: from patient, Family member GENERAL:  no fevers, fatigue, appetite changes SKIN: No itching, or rash EYES: No eye pain, redness, discharge EARS: No earache, tinnitus, change in hearing NOSE: No congestion, drainage or bleeding  MOUTH/THROAT: No mouth or tooth pain, No sore throat RESPIRATORY: No cough, wheezing, SOB CARDIAC: No chest pain, palpitations, lower extremity edema  GI: No abdominal pain, No N/V/D or constipation, No heartburn or reflux  GU: No dysuria, frequency or urgency, or incontinence  MUSCULOSKELETAL: No unrelieved bone/joint pain NEUROLOGIC: No headache, dizziness or focal weakness PSYCHIATRIC: No c/o anxiety or sadness ; patient would like his Benadryl for sleep at night  Vitals:   12/12/16 1318  BP: 136/78  Pulse: 66  Resp: 18  Temp: 98.2 F (36.8 C)  SpO2: 98%    SpO2 Readings from Last 1 Encounters:  12/12/16 98%   Body mass index is 41.66 kg/m.     Physical Exam  GENERAL APPEARANCE: Alert, conversant,  No acute distress.  SKIN: No diaphoresis rash; Incision areas looked clean and are healing HEAD: Normocephalic, atraumatic  EYES: Conjunctiva/lids clear. Pupils round, reactive. EOMs intact.  EARS: External exam WNL, canals  clear. Hearing grossly normal.  NOSE: No deformity or discharge.  MOUTH/THROAT: Lips w/o lesions  RESPIRATORY: Breathing is even, unlabored. Lung sounds are clear   CARDIOVASCULAR:  Heart RRR no murmurs, rubs or gallops. No peripheral edema.   GASTROINTESTINAL: Abdomen is soft, non-tender, not distended w/ normal bowel sounds. GENITOURINARY: Bladder non tender, not distended  MUSCULOSKELETAL: No abnormal joints or musculature NEUROLOGIC:  Cranial nerves 2-12 grossly intact. Moves all extremities  PSYCHIATRIC: Mood and affect appropriate to situation, no behavioral issues  Patient Active Problem List   Diagnosis Date Noted  . Abnormal LFTs 11/28/2016  . Paroxysmal atrial fibrillation (HCC) 11/28/2016  . RLS (restless legs syndrome) 08/03/2016  . Restrictive lung disease 06/02/2016  . AKI (acute kidney injury) (HCC) 02/21/2016  . Uncontrolled type 2 diabetes mellitus with diabetic polyneuropathy, with long-term current use of insulin (HCC) 08/12/2015  . Anemia due to stage 3 chronic kidney disease 06/14/2015  . Benign hypertension with chronic kidney disease, stage III 06/14/2015  . Diabetes mellitus with stage 3 chronic kidney disease (HCC) 06/14/2015  . Vitamin D deficiency 06/14/2015  . CAD (coronary artery disease) 02/22/2015  . Chronic kidney disease 02/22/2015  . Obstructive sleep apnea 02/22/2015  . Old myocardial infarction 02/22/2015  . Right posterior capsular opacification 02/04/2015  . Pseudogout 12/25/2013  . Status post intraocular lens implant 05/15/2013  . Adjustment disorder with depressed mood 03/03/2013  . Acquired spondylolisthesis 09/14/2012  . Depressive disorder 09/14/2012  . Displacement of lumbar intervertebral disc 09/14/2012  . Morbid obesity (HCC) 09/14/2012  . Tear of medial cartilage or meniscus of knee, current 09/14/2012  . Diabetic macular edema (HCC) 09/12/2012  . 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 associated  with type 2 diabetes mellitus (HCC) 08/01/2012      Labs reviewed: Basic Metabolic Panel:    Component Value Date/Time   NA 134 (L) 11/27/2016 2035   K 4.8 11/27/2016 2035   CL 103 11/27/2016 2035   CO2 19 (L) 11/27/2016 2035   GLUCOSE 487 (H) 11/27/2016 2035   BUN 40 (H) 11/27/2016 2035   CREATININE 2.09 (H) 11/27/2016 2035   CALCIUM 8.5 (L) 11/27/2016 2035   PROT 6.5 11/27/2016 2035   ALBUMIN 3.4 (L) 11/27/2016 2035   AST 108 (H) 11/27/2016 2035   ALT 113 (H) 11/27/2016 2035   ALKPHOS 254 (H) 11/27/2016 2035   BILITOT 4.5 (H) 11/27/2016 2035   GFRNONAA 29 (L) 11/27/2016 2035   GFRAA 33 (L) 11/27/2016 2035     Recent Labs  11/27/16 2035  NA 134*  K 4.8  CL 103  CO2 19*  GLUCOSE 487*  BUN 40*  CREATININE 2.09*  CALCIUM 8.5*   Liver Function Tests:  Recent Labs  11/27/16 2035  AST 108*  ALT 113*  ALKPHOS 254*  BILITOT 4.5*  PROT 6.5  ALBUMIN 3.4*    Recent Labs  11/27/16 2035  LIPASE 26   No results for input(s): AMMONIA in the last 8760 hours. CBC:  Recent Labs  11/27/16 2035  WBC 3.7*  NEUTROABS 2.8  HGB 9.7*  HCT 29.1*  MCV 87.1  PLT 95*   Lipid No results for input(s): CHOL, HDL, LDLCALC, TRIG in the last 8760 hours.  Cardiac Enzymes:  Recent Labs  11/27/16 2035  TROPONINI 0.06*   BNP:  Recent Labs  11/27/16 2035  BNP 207.7*   No results found for: MICROALBUR No results found for: HGBA1C No results found for: TSH No results found for: VITAMINB12 No results found for: FOLATE No results found for: IRON, TIBC, FERRITIN  Imaging and Procedures obtained prior to SNF admission: Ct Abdomen Pelvis Wo Contrast  Result Date:  11/27/2016 CLINICAL DATA:  Diarrhea and vomiting EXAM: CT ABDOMEN AND PELVIS WITHOUT CONTRAST TECHNIQUE: Multidetector CT imaging of the abdomen and pelvis was performed following the standard protocol without IV contrast. COMPARISON:  None. FINDINGS: Lower chest: No pulmonary nodules or pleural effusion.  No visible pericardial effusion. There are coronary artery calcifications. Hepatobiliary: Borderline hepatic steatosis. No biliary dilatation. The gallbladder is nondistended and contains hyperdense material. Pancreas: Normal contours without ductal dilatation. No peripancreatic fluid collection. Spleen: Normal. Adrenals/Urinary Tract: --Adrenal glands: Linear right adrenal gland is consistent with pelvic positioning of right kidney. --Right kidney/ureter: Pelvic right kidney.  No hydronephrosis. --Left kidney/ureter: No hydronephrosis or perinephric stranding. No nephrolithiasis. No obstructing ureteral stones. --Urinary bladder: Unremarkable. Stomach/Bowel: --Stomach/Duodenum: No hiatal hernia or other gastric abnormality. Normal duodenal course and caliber. --Small bowel: No dilatation or inflammation. --Colon: No focal abnormality. --Appendix: Normal. Vascular/Lymphatic: Atherosclerotic calcification is present within the non-aneurysmal abdominal aorta, without hemodynamically significant stenosis. No abdominal or pelvic lymphadenopathy. Reproductive: Normal prostate and seminal vesicles. Musculoskeletal. 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 Other: None. IMPRESSION: 1. Nondistended gallbladder with hyperdense contents. This may indicate cholelithiasis, but is indeterminate. Other considerations would include refluxed enteric contrast if the patient has a history of prior biliary intervention. The Right upper quadrant ultrasound is recommended for further characterization. 2. No colonic or enteric acute inflammation. 3. 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 5. Coronary artery and Aortic Atherosclerosis (ICD10-I70.0). Electronically Signed   By: Deatra RobinsonKevin  Herman M.D.   On: 11/27/2016 22:57   Dg Chest Port 1 View  Result Date: 11/27/2016 CLINICAL DATA:  Vomiting, elevated troponin. EXAM: PORTABLE CHEST 1  VIEW COMPARISON:  None. FINDINGS: Cardiac silhouette is moderately enlarged, mediastinal silhouette is unremarkable. Pulmonary vascular congestion and mild interstitial prominence. No pleural effusion or focal consolidation. No pneumothorax. Soft tissue planes and included osseous structures are nonsuspicious. IMPRESSION: Moderate cardiomegaly. Interstitial prominence most compatible with pulmonary edema. Electronically Signed   By: Awilda Metroourtnay  Bloomer M.D.   On: 11/27/2016 22:25   [place] Abdomen Limited Ruq  Result Date: 11/28/2016 CLINICAL DATA:  Elevated LFTs.  Right upper quadrant pain. EXAM: ULTRASOUND ABDOMEN LIMITED RIGHT UPPER QUADRANT COMPARISON:  CT abdomen/pelvis 2 hours prior. FINDINGS: Gallbladder: Contracted. Diffuse gallbladder wall thickening of 5 6 mm. Multiple gallstones in intraluminal sludge. Possible minimal pericholecystic fluid. A positive sonographic Eulah PontMurphy sign was noted by sonographer. Common bile duct: Diameter: 6 mm, normal for age. Liver: No focal lesion identified. Borderline increase in parenchymal echogenicity. Liver parenchyma is difficult to penetrate. Portal vein is patent on color Doppler imaging with normal direction of blood flow towards the liver. IMPRESSION: 1. Contracted gallbladder with gallstones, gallbladder wall thickening, and positive sonographic Murphy sign. Constellation of findings consistent with cholecystitis. This is likely acute, however there may be a degree of underlying chronic gallbladder inflammation given the contracted gallbladder. No biliary dilatation. 2. Borderline hepatic steatosis. Electronically Signed   By: Rubye OaksMelanie  Ehinger M.D.   On: 11/28/2016 00:50     Not all labs, radiology exams or other studies done during hospitalization come through on my EPIC note; however they are reviewed by me.    Assessment and Plan  ACUTE CHOLECYSTITIS IS due TO BILIARY CALCULUS-status post ERCP with GI on 8/23 and lap: We with surgery on 8/24; right upper  quadrant ultrasound with cirrhosis, no military dilatation-denies history of alcohol use or family history of NASH; incidental finding of pancreatic tail lesion which  will need follow-up with GI and repeat imaging SNF - admitted with generalized weakness for OT/PT; will repeat liver enzymes  URINARY RETENTION POST/ACUTE RENAL FAILURE: CK D stage III-baseline creatinine 2-2.3; improved with IV fluids and Foley placement; patient passed voiding trial prior to discharge SNF - will continue Cardura 4 mg daily and Lasix 20 mg by mouth daily; follow BMP  HYPERCHLOREMIC ACIDOSIS/HYPOMAGNESEMIA SNF -continue bicarbonate 650 mg twice a day and mag oxide 800 mg by mouth 3 times a day; will follow-up BMP with magnesium  PANCYTOPENIA-chronic and stable; although anticoagulation for platelets less than 70 SNF - will monitor CBC  Diabetes mellitus type 2 diabetic polyneuropathy-hemoglobin A1c 8.0; controlled in hospital on Lantus and Humalog with sliding scale, patient was on a insulin pump priori SNF - continue Lantus 15 units nightly, Humalog 8 units after meals in addition to sliding scale Humalog with meals-will use sensitive sliding scale  ANEMIA IN CKD-based on hemoglobin 9-11 SNF - will follow-up CBC  [facility] SNF - will be using CPAP nightly  HYPERTENSION SNF - controlled continue Coreg 6.25 mg twice a day, Vasotec 20 mg by mouth twice a day, Lasix 20 minutes milligrams by mouth daily and hydralazine 25 mg by mouth 3 times a day  DEPRESSION SNF - continue Remeron 30 mg by mouth daily at bedtime; and Effexor 37.5 mg twice a day  HYPERLIPIDEMIA SNF - not stated as uncontrolled continue Crestor 40 mg daily    Time spent greater than 45 minutes ;> 50% of time with patient was spent reviewing records, labs, tests and studies, counseling and developing plan of care  Thurston Hole D. Lyn Hollingshead, MD

## 2016-12-13 LAB — BASIC METABOLIC PANEL
BUN: 20 (ref 4–21)
Creatinine: 1.6 — AB (ref 0.6–1.3)
GLUCOSE: 146
Potassium: 4.1 (ref 3.4–5.3)
SODIUM: 141 (ref 137–147)

## 2016-12-13 LAB — CBC AND DIFFERENTIAL
HEMATOCRIT: 30 — AB (ref 41–53)
HEMOGLOBIN: 9.3 — AB (ref 13.5–17.5)
PLATELETS: 138 — AB (ref 150–399)
WBC: 5.3

## 2016-12-17 ENCOUNTER — Encounter: Payer: Self-pay | Admitting: Internal Medicine

## 2016-12-17 DIAGNOSIS — N183 Chronic kidney disease, stage 3 (moderate): Secondary | ICD-10-CM

## 2016-12-17 DIAGNOSIS — R338 Other retention of urine: Secondary | ICD-10-CM | POA: Insufficient documentation

## 2016-12-17 DIAGNOSIS — E872 Acidosis: Secondary | ICD-10-CM | POA: Insufficient documentation

## 2016-12-17 DIAGNOSIS — D61818 Other pancytopenia: Secondary | ICD-10-CM | POA: Insufficient documentation

## 2016-12-17 DIAGNOSIS — N179 Acute kidney failure, unspecified: Secondary | ICD-10-CM | POA: Insufficient documentation

## 2016-12-17 DIAGNOSIS — E1169 Type 2 diabetes mellitus with other specified complication: Secondary | ICD-10-CM | POA: Insufficient documentation

## 2016-12-17 DIAGNOSIS — E8729 Other acidosis: Secondary | ICD-10-CM | POA: Insufficient documentation

## 2016-12-17 DIAGNOSIS — K8000 Calculus of gallbladder with acute cholecystitis without obstruction: Secondary | ICD-10-CM | POA: Insufficient documentation

## 2016-12-17 DIAGNOSIS — E785 Hyperlipidemia, unspecified: Secondary | ICD-10-CM

## 2016-12-22 ENCOUNTER — Encounter: Payer: Self-pay | Admitting: Internal Medicine

## 2016-12-22 ENCOUNTER — Non-Acute Institutional Stay (SKILLED_NURSING_FACILITY): Payer: Medicare Other | Admitting: Internal Medicine

## 2016-12-22 DIAGNOSIS — E872 Acidosis: Secondary | ICD-10-CM

## 2016-12-22 DIAGNOSIS — E785 Hyperlipidemia, unspecified: Secondary | ICD-10-CM

## 2016-12-22 DIAGNOSIS — E1169 Type 2 diabetes mellitus with other specified complication: Secondary | ICD-10-CM

## 2016-12-22 DIAGNOSIS — I129 Hypertensive chronic kidney disease with stage 1 through stage 4 chronic kidney disease, or unspecified chronic kidney disease: Secondary | ICD-10-CM

## 2016-12-22 DIAGNOSIS — N183 Chronic kidney disease, stage 3 unspecified: Secondary | ICD-10-CM

## 2016-12-22 DIAGNOSIS — IMO0002 Reserved for concepts with insufficient information to code with codable children: Secondary | ICD-10-CM

## 2016-12-22 DIAGNOSIS — F329 Major depressive disorder, single episode, unspecified: Secondary | ICD-10-CM | POA: Diagnosis not present

## 2016-12-22 DIAGNOSIS — R338 Other retention of urine: Secondary | ICD-10-CM

## 2016-12-22 DIAGNOSIS — N179 Acute kidney failure, unspecified: Secondary | ICD-10-CM | POA: Diagnosis not present

## 2016-12-22 DIAGNOSIS — D631 Anemia in chronic kidney disease: Secondary | ICD-10-CM

## 2016-12-22 DIAGNOSIS — K8000 Calculus of gallbladder with acute cholecystitis without obstruction: Secondary | ICD-10-CM

## 2016-12-22 DIAGNOSIS — F32A Depression, unspecified: Secondary | ICD-10-CM

## 2016-12-22 DIAGNOSIS — G4733 Obstructive sleep apnea (adult) (pediatric): Secondary | ICD-10-CM

## 2016-12-22 DIAGNOSIS — E1165 Type 2 diabetes mellitus with hyperglycemia: Secondary | ICD-10-CM

## 2016-12-22 DIAGNOSIS — D61818 Other pancytopenia: Secondary | ICD-10-CM | POA: Diagnosis not present

## 2016-12-22 DIAGNOSIS — Z794 Long term (current) use of insulin: Secondary | ICD-10-CM

## 2016-12-22 DIAGNOSIS — E1142 Type 2 diabetes mellitus with diabetic polyneuropathy: Secondary | ICD-10-CM | POA: Diagnosis not present

## 2016-12-22 DIAGNOSIS — E8729 Other acidosis: Secondary | ICD-10-CM

## 2016-12-22 NOTE — Progress Notes (Signed)
Location:   [facility] Room Number: 109 Place of Service:   SNF [phone]) Provider: Randon Goldsmith. Lyn Hollingshead, MD  PCP: Andreas Blower., MD Patient Care Team: Andreas Blower., MD as PCP - General (Internal Medicine)  Extended Emergency Contact Information Primary Emergency Contact: Duan,Jean Address: [address]           [place], [place] [address] [place] Home Phone: [phone] Relation: Spouse  Allergies  Allergen Reactions  . Lyrica [Pregabalin]   . Norvasc [Amlodipine Besylate]   . Penicillins     Chief Complaint  Patient presents with  . Discharge Note    discharge from SNF to home    HPI:  76 y.o. male  with insulin-dependent diabetes mellitus, peripheral neuropathy, hypertension, chronic kidney disease stage III, and arthritis who was admitted to [facility] from 8/23-31 for acute cholecystitis secondary to choledocholithiasis. He underwent ERCP on 8/23 with GI and laparoscopic cholecystectomy on 8/24 with surgery. Hospital course was, K by acute on chronic renal failure post surgery and urinary retention which improved with IV fluids and Foley catheter respectively. Patient was able to pass a voiding trial prior to discharge. Patient was admitted to skilled nursing facility for OT/PT and is now ready to be discharged home.    Past Medical History:  Diagnosis Date  . Abnormal LFTs 11/28/2016  . Acquired spondylolisthesis 09/14/2012  . Adjustment disorder with depressed mood 03/03/2013  . AKI (acute kidney injury) (HCC) 02/21/2016  . Anemia due to stage 3 chronic kidney disease 06/14/2015  . Back pain   . Benign hypertension with chronic kidney disease, stage III 06/14/2015  . CAD (coronary artery disease) 02/22/2015  . Chronic kidney disease 02/22/2015  . Depressive disorder 09/14/2012  . Diabetes mellitus with stage 3 chronic kidney disease (HCC) 06/14/2015  . Diabetes mellitus without complication (HCC)   . Diabetic  macular edema (HCC) 09/12/2012  . 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 Displacement of lumbar intervertebral disc 09/14/2012  . GERD (gastroesophageal reflux disease)   . High cholesterol   . Hypertension   . Morbid obesity (HCC) 09/14/2012  . Obesity   . Obstructive sleep apnea 02/22/2015  . Old myocardial infarction 02/22/2015  . Paroxysmal atrial fibrillation (HCC) 11/28/2016  . Pseudogout 12/25/2013   Overview:  Based on Right olecranon bursa synovial fluid analysis  . Renal disorder   . Restrictive lung disease 06/02/2016  . Right posterior capsular opacification 02/04/2015  . RLS (restless legs syndrome) 08/03/2016  . Status post intraocular lens implant 05/15/2013  . Tear of medial cartilage or meniscus of knee, current 09/14/2012  . Uncontrolled type 2 diabetes mellitus with diabetic polyneuropathy, with long-term current use of insulin (HCC) 08/12/2015  . Vitamin D deficiency 06/14/2015    Past Surgical History:  Procedure Laterality Date  . CARDIAC SURGERY  2000   stents placed in heart  . KNEE SURGERY Left 09/14/2012  . LUMBAR DISC SURGERY       reports that he quit smoking about 3 months ago. He has never used smokeless tobacco. He reports that he does not drink alcohol or use drugs. Social History   Social History  . Marital status: Married    Spouse name: N/A  . Number of children: N/A  . Years of education: N/A   Occupational History  . Not on file.   Social History Main Topics  .  Smoking status: Former Smoker    Quit date: 09/28/2016  . Smokeless tobacco: Never Used  . Alcohol use No  . Drug use: No  . Sexual activity: Not on file   Other Topics Concern  . Not on file   Social History Narrative  . No narrative on file    Pertinent  Health Maintenance Due  Topic Date Due  . HEMOGLOBIN A1C  [birth year]  . FOOT EXAM  12/23/1950  . OPHTHALMOLOGY EXAM  12/23/1950  .  INFLUENZA VACCINE  11/08/2016  . PNA vac Low Risk Adult  Completed    Medications: Allergies as of 12/22/2016      Reactions   Lyrica [pregabalin]    Norvasc [amlodipine Besylate]    Penicillins       Medication List       Accurate as of 12/22/16 11:59 PM. Always use your most recent med list.          aspirin 81 MG chewable tablet Chew 81 mg by mouth daily.   carvedilol 6.25 MG tablet Commonly known as:  COREG Take 6.25 mg by mouth 2 (two) times daily with a meal.   docusate sodium 100 MG capsule Commonly known as:  COLACE Take 100 mg by mouth. Take one capsul 3 times daily. Stop taking if you have diarrhea   doxazosin 2 MG tablet Commonly known as:  CARDURA Take 2 mg by mouth daily.   enalapril 20 MG tablet Commonly known as:  VASOTEC Take 20 mg by mouth 2 (two) times daily.   fentaNYL 25 MCG/HR patch Commonly known as:  DURAGESIC - dosed mcg/hr Place 25 mcg onto the skin every 3 (three) days.   furosemide 20 MG tablet Commonly known as:  LASIX Take 20 mg by mouth. Take one tablet daily   HUMALOG KWIKPEN Bassett Inject into the skin. Inject 8 units three times daily after meals   hydrALAZINE 25 MG tablet Commonly known as:  APRESOLINE Take 25 mg by mouth 3 (three) times daily.   HYDROcodone-acetaminophen 10-325 MG tablet Commonly known as:  NORCO Take 1 tablet by mouth 2 (two) times daily as needed.   LANTUS 100 UNIT/ML injection Generic drug:  insulin glargine Inject 15 Units into the skin. Inject 15 units nightly   LIDOCARE BACK/SHOULDER 4 % Ptch Generic drug:  Lidocaine Apply topically. Apply 1-3 patches topically daily as needed   magnesium oxide 400 MG tablet Commonly known as:  MAG-OX Take 400 mg by mouth. Take 2 tablets three times daily   mirtazapine 30 MG tablet Commonly known as:  REMERON Take 30 mg by mouth. Take one tablet nightly   nitroGLYCERIN 0.4 MG SL tablet Commonly known as:  NITROSTAT Place 0.4 mg under the tongue. Place one  SL tablet under the tongue every 5 minutes as needed for chest pain for 3 doses   nystatin powder Commonly known as:  MYCOSTATIN/NYSTOP Apply topically 2 (two) times daily.   omeprazole 20 MG capsule Commonly known as:  PRILOSEC Take 20 mg by mouth 2 (two) times daily.   polyvinyl alcohol 1.4 % ophthalmic solution Commonly known as:  LIQUIFILM TEARS Place 1 drop into both eyes as needed for dry eyes.   rosuvastatin 40 MG tablet Commonly known as:  CRESTOR Take 40 mg by mouth daily.   senna 8.6 MG tablet Commonly known as:  SENOKOT Take 1 tablet by mouth. Take 2 tablets 2 times daily. Stop taking if you have diarrhea.   sodium bicarbonate 650 MG tablet  Take 650 mg by mouth. Take one tablet twice daily   Ubiquinol 100 MG Caps Take by mouth. Take one capsule daily   venlafaxine 37.5 MG tablet Commonly known as:  EFFEXOR Take 37.5 mg by mouth. Take one tablet daily   Vitamin D (Ergocalciferol) 2000 units Caps Take 2,000 Units by mouth. Take one capsule daily            Discharge Care Instructions        Start     Ordered   12/22/16 0000  CBC and differential    Comments:  This external order was created through the Results Console.    12/22/16 1159   12/22/16 0000  Basic metabolic panel    Comments:  This external order was created through the Results Console.    12/22/16 1159       Vitals:   12/22/16 1134  BP: (!) 158/78  Pulse: 64  Resp: 18  Temp: 98.4 F (36.9 C)  SpO2: 96%  Weight: 234 lb (106.1 kg)  Height:  (1.702 m)   Body mass index is 36.65 kg/m.  Physical Exam  GENERAL APPEARANCE: Alert, conversant. No acute distress.  HEENT: Unremarkable. RESPIRATORY: Breathing is even, unlabored. Lung sounds are clear   CARDIOVASCULAR: Heart RRR no murmurs, rubs or gallops. No peripheral edema.  GASTROINTESTINAL: Abdomen is soft, non-tender, not distended w/ normal bowel sounds.  NEUROLOGIC: Cranial nerves 2-12 grossly intact. Moves all  extremities   Labs reviewed: Basic Metabolic Panel:  Recent Labs  95/62/13 2035 12/13/16  NA 134* 141  K 4.8 4.1  CL 103  --   CO2 19*  --   GLUCOSE 487*  --   BUN 40* 20  CREATININE 2.09* 1.6*  CALCIUM 8.5*  --    No results found for: [facility] Liver Function Tests:  Recent Labs  11/27/16 2035  AST 108*  ALT 113*  ALKPHOS 254*  BILITOT 4.5*  PROT 6.5  ALBUMIN 3.4*    Recent Labs  11/27/16 2035  LIPASE 26   No results for input(s): AMMONIA in the last 8760 hours. CBC:  Recent Labs  11/27/16 2035 12/13/16  WBC 3.7* 5.3  NEUTROABS 2.8  --   HGB 9.7* 9.3*  HCT 29.1* 30*  MCV 87.1  --   PLT 95* 138*   Lipid No results for input(s): CHOL, HDL, LDLCALC, TRIG in the last 8760 hours. Cardiac Enzymes:  Recent Labs  11/27/16 2035  TROPONINI 0.06*   BNP:  Recent Labs  11/27/16 2035  BNP 207.7*   CBG: No results for input(s): GLUCAP in the last 8760 hours.  Procedures and Imaging Studies During Stay: No results found.  Assessment/Plan:   Acute cholecystitis due to biliary calculus  Acute renal failure superimposed on stage 3 chronic kidney disease, unspecified acute renal failure type (HCC)  Acute urinary retention  Hyperchloremic acidosis  Hypomagnesemia  Pancytopenia (HCC)  Anemia due to stage 3 chronic kidney disease  Uncontrolled type 2 diabetes mellitus with diabetic polyneuropathy, with long-term current use of insulin (HCC)  Obstructive sleep apnea  Benign hypertension with chronic kidney disease, stage III  Depressive disorder  Hyperlipidemia associated with type 2 diabetes mellitus (HCC)   Patient is being discharged with the following home health services:  OT/PT/nursing  Patient is being discharged with the following durable medical equipment:  None  Patient has been advised to f/u with their PCP in 1-2 weeks to bring them up to date on their rehab stay.  Social  services at facility was responsible for arranging  this appointment.  Pt was provided with a 30 day supply of prescriptions for medications and refills must be obtained from their PCP.  For controlled substances, a more limited supply may be provided adequate until PCP appointment only.  Medications have been reconciled.  Time spent greater than 30 minutes;> 50% of time with patient was spent reviewing records, labs, tests and studies, counseling and developing plan of care  Randon Goldsmith. Lyn Hollingshead, MD

## 2017-05-29 HISTORY — PX: OTHER SURGICAL HISTORY: SHX169

## 2017-06-13 HISTORY — PX: OTHER SURGICAL HISTORY: SHX169

## 2017-08-23 HISTORY — PX: BLEPHAROPLASTY: SUR158

## 2017-08-27 LAB — HEPATIC FUNCTION PANEL: Bilirubin, Direct: 0.5 — AB (ref 0.01–0.4)

## 2017-09-08 LAB — HEPATIC FUNCTION PANEL
ALK PHOS: 170 — AB (ref 25–125)
ALT: 21 (ref 10–40)
AST: 36 (ref 14–40)
Bilirubin, Total: 0.8

## 2017-09-17 LAB — POCT INR: INR: 1.1 (ref 0.9–1.1)

## 2017-09-18 HISTORY — PX: GASTROSTOMY TUBE PLACEMENT: SHX655

## 2017-09-20 LAB — CBC AND DIFFERENTIAL
HCT: 31 — AB (ref 41–53)
Hemoglobin: 10.1 — AB (ref 13.5–17.5)
PLATELETS: 201 (ref 150–399)
WBC: 9.1

## 2017-09-21 LAB — BASIC METABOLIC PANEL
BUN: 37 — AB (ref 4–21)
CREATININE: 2.1 — AB (ref 0.6–1.3)
POTASSIUM: 4.5 (ref 3.4–5.3)
Sodium: 134 — AB (ref 137–147)

## 2017-09-24 ENCOUNTER — Non-Acute Institutional Stay (SKILLED_NURSING_FACILITY): Payer: Medicare Other | Admitting: Internal Medicine

## 2017-09-24 ENCOUNTER — Encounter: Payer: Self-pay | Admitting: Internal Medicine

## 2017-09-24 DIAGNOSIS — I5032 Chronic diastolic (congestive) heart failure: Secondary | ICD-10-CM | POA: Diagnosis not present

## 2017-09-24 DIAGNOSIS — E1142 Type 2 diabetes mellitus with diabetic polyneuropathy: Secondary | ICD-10-CM

## 2017-09-24 DIAGNOSIS — J9621 Acute and chronic respiratory failure with hypoxia: Secondary | ICD-10-CM

## 2017-09-24 DIAGNOSIS — E1165 Type 2 diabetes mellitus with hyperglycemia: Secondary | ICD-10-CM

## 2017-09-24 DIAGNOSIS — K219 Gastro-esophageal reflux disease without esophagitis: Secondary | ICD-10-CM | POA: Diagnosis not present

## 2017-09-24 DIAGNOSIS — I129 Hypertensive chronic kidney disease with stage 1 through stage 4 chronic kidney disease, or unspecified chronic kidney disease: Secondary | ICD-10-CM

## 2017-09-24 DIAGNOSIS — G9341 Metabolic encephalopathy: Secondary | ICD-10-CM

## 2017-09-24 DIAGNOSIS — S0501XD Injury of conjunctiva and corneal abrasion without foreign body, right eye, subsequent encounter: Secondary | ICD-10-CM | POA: Diagnosis not present

## 2017-09-24 DIAGNOSIS — N179 Acute kidney failure, unspecified: Secondary | ICD-10-CM | POA: Diagnosis not present

## 2017-09-24 DIAGNOSIS — G4733 Obstructive sleep apnea (adult) (pediatric): Secondary | ICD-10-CM | POA: Diagnosis not present

## 2017-09-24 DIAGNOSIS — N183 Chronic kidney disease, stage 3 (moderate): Secondary | ICD-10-CM

## 2017-09-24 DIAGNOSIS — Z794 Long term (current) use of insulin: Secondary | ICD-10-CM

## 2017-09-24 DIAGNOSIS — R4702 Dysphasia: Secondary | ICD-10-CM | POA: Diagnosis not present

## 2017-09-24 DIAGNOSIS — J9622 Acute and chronic respiratory failure with hypercapnia: Secondary | ICD-10-CM

## 2017-09-24 DIAGNOSIS — IMO0002 Reserved for concepts with insufficient information to code with codable children: Secondary | ICD-10-CM

## 2017-09-24 NOTE — Progress Notes (Signed)
: Provider:  Randon GoldsmithAnne D. Lyn HollingsheadAlexander, MD Location:  [facility] Room Number: [phone] Place of Service:  SNF ([phone])  PCP: Andreas Blowerabeza, Yuri M., MD Patient Care Team: Andreas Blowerabeza, Yuri M., MD as PCP - General (Internal Medicine)  Extended Emergency Contact Information Primary Emergency Contact: Gobin,Jean Address: [address]           [place], [place] [address] [place] Home Phone: [phone] Relation: Spouse     Allergies: Lyrica [pregabalin]; Norvasc [amlodipine besylate]; and Penicillins  Chief Complaint  Patient presents with  . New Admit To SNF    Admit to Facility     HPI: Patient is 77 y.o. male is a stage III, hypertension, OSA on CPAP, diabetes mellitus type 2 who [facility] with unresponsiveness.  Patient's blood sugar was found to be 37 at home EMS was called, and patient was given D10 per EMS. Patient remained unresponsive with right-sided fixed gaze.  Was admitted [facility] 5/18-6/14 where he was treated for acute metabolic encephalopathy, acute on chronic hypoxic and hyper [place] respiratory failure secondary to aspiration pneumonia which was secondary to dysphasia.  Patient had a PEG tube placed.  Hospital course was further complicated by acute on chronic renal failure which improved with IV fluids.  Patient is admitted to skilled nursing facility for OT/PT.  While at skilled nursing facility patient will be followed for a right corneal abrasion treated with ciprofloxacin ophthalmic solution and erythromycin ointment, chronic diastolic congestive heart failure treated with Coreg and Lasix, and GERD treated with Protonix.  Past Medical History:  Diagnosis Date  . Abnormal LFTs 11/28/2016  . Acquired spondylolisthesis 09/14/2012  . Adjustment disorder with depressed mood 03/03/2013  . AKI (acute kidney injury) (HCC) 02/21/2016  . 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 Back pain   . Benign hypertension with chronic kidney disease, stage III (HCC) 06/14/2015  . CAD (coronary artery disease) 02/22/2015  . Chronic kidney disease 02/22/2015  . Depressive disorder 09/14/2012  . Diabetes mellitus with stage 3 chronic kidney disease (HCC) 06/14/2015  . Diabetes mellitus without complication (HCC)   . Diabetic macular edema (HCC) 09/12/2012  . 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 Displacement of lumbar intervertebral disc 09/14/2012  . GERD (gastroesophageal reflux disease)   . High cholesterol   . Hypertension   . Morbid obesity (HCC) 09/14/2012  . Obesity   . Obstructive sleep apnea 02/22/2015  . Old myocardial infarction 02/22/2015  . Paroxysmal atrial fibrillation (HCC) 11/28/2016  . Pseudogout 12/25/2013   Overview:  Based on Right olecranon bursa synovial fluid analysis  . Renal disorder   . Restrictive lung disease 06/02/2016  . Right posterior capsular opacification 02/04/2015  . RLS (restless legs syndrome) 08/03/2016  . Status post intraocular lens implant 05/15/2013  . Tear of medial cartilage or meniscus of knee, current 09/14/2012  . Uncontrolled type 2 diabetes mellitus with diabetic polyneuropathy, with long-term current use of insulin (HCC) 08/12/2015  . Vitamin D deficiency 06/14/2015    Past Surgical History:  Procedure Laterality Date  . CARDIAC SURGERY  2000   stents placed in heart  . KNEE SURGERY Left 09/14/2012  . LUMBAR DISC SURGERY      Allergies as of 09/24/2017      Reactions   Lyrica [pregabalin]    Norvasc [  amlodipine Besylate]    Penicillins       Medication List        Accurate as of 09/24/17  9:27 AM. Always use your most recent med list.          acetaminophen 160 MG/5ML suspension Commonly known as:  TYLENOL Place 650 mg into feeding tube every 6 (six) hours as needed for fever.   albuterol 108 (90 Base) MCG/ACT  inhaler Commonly known as:  PROVENTIL HFA;VENTOLIN HFA Inhale 2 puffs into the lungs every 6 (six) hours as needed for wheezing.   aspirin 81 MG chewable tablet Place 81 mg into feeding tube daily.   atorvastatin 40 MG tablet Commonly known as:  LIPITOR Place 40 mg into feeding tube at bedtime.   brimonidine 0.2 % ophthalmic solution Commonly known as:  ALPHAGAN Place 1 drop into both eyes 3 (three) times daily.   carvedilol 6.25 MG tablet Commonly known as:  COREG Place 6.25 mg into feeding tube 2 (two) times daily with a meal.   Cholecalciferol 2000 units Caps Place 1 capsule into feeding tube daily.   ciprofloxacin 0.3 % ophthalmic solution Commonly known as:  CILOXAN Place 2 drops into the right eye every 4 (four) hours while awake. Administer 1 drop, every 2 hours, while awake, for 2 days. Then 1 drop, every 4 hours, while awake, for the next 5 days.   docusate 50 MG/5ML liquid Commonly known as:  COLACE Place 100 mg into feeding tube 2 (two) times daily.   doxazosin 2 MG tablet Commonly known as:  CARDURA Place 2 mg into feeding tube daily.   erythromycin ophthalmic ointment Place 1 application into the right eye 4 (four) times daily. And instil into both eyes at bedtime   folic acid 1 MG tablet Commonly known as:  FOLVITE Place 1 mg into feeding tube daily.   HUMALOG KWIKPEN [place] Inject 1-8 Units into the skin every 6 (six) hours.   hydrALAZINE 50 MG tablet Commonly known as:  APRESOLINE Place 50 mg into feeding tube every 8 (eight) hours.   LANTUS 100 UNIT/ML injection Generic drug:  insulin glargine Inject 20 Units into the skin 2 (two) times daily.   Lidocaine-Menthol 4-4 % Ptch Apply 1-3 patches topically daily as needed.   nitroGLYCERIN 0.4 MG SL tablet Commonly known as:  NITROSTAT Place 0.4 mg under the tongue. Place one SL tablet under the tongue every 5 minutes as needed for chest pain for 3 doses   pantoprazole sodium 40 mg/20 mL  Pack Commonly known as:  PROTONIX Place 40 mg into feeding tube daily.   polyvinyl alcohol 1.4 % ophthalmic solution Commonly known as:  LIQUIFILM TEARS Place 1 drop into both eyes as needed for dry eyes.   polyvinyl alcohol 1.4 % ophthalmic solution Commonly known as:  LIQUIFILM TEARS Place 1 drop into both eyes as needed for dry eyes.   thiamine 100 MG tablet Place 100 mg into feeding tube daily.       No orders of the defined types were placed in this encounter.   Immunization History  Administered Date(s) Administered  . Influenza, High Dose Seasonal PF 01/05/2016  . Pneumococcal Conjugate-13 06/06/2013  . Pneumococcal Polysaccharide-23 02/13/2007  . Tdap 03/07/2010  . Zoster 09/17/2007    Social History   Tobacco Use  . Smoking status: Former Smoker    Last attempt to quit: 09/28/2016    Years since quitting: 0.9  . Smokeless tobacco: Never Used  Substance Use Topics  .  Alcohol use: No    Family history is   Family History  Problem Relation Age of Onset  . Diabetes Mother   . Hypertension Mother   . Hypertension Father   . Stroke Father   . Cancer Sister        bowel  . Diabetes Maternal Grandmother       Review of Systems  DATA OBTAINED: from patient, nurse GENERAL:  no fevers, fatigue, appetite changes SKIN: No itching, or rash EYES: No eye pain, redness, discharge EARS: No earache, tinnitus, change in hearing NOSE: No congestion, drainage or bleeding  MOUTH/THROAT: No mouth or tooth pain, No sore throat RESPIRATORY: No cough, wheezing, SOB CARDIAC: No chest pain, palpitations, lower extremity edema  GI: No abdominal pain, No N/V/D or constipation, No heartburn or reflux  GU: No dysuria, frequency or urgency, or incontinence  MUSCULOSKELETAL: No unrelieved bone/joint pain NEUROLOGIC: No headache, dizziness or focal weakness PSYCHIATRIC: No c/o anxiety or sadness   Vitals:   09/24/17 0831  BP: (!) 128/57  Pulse: 71  Resp: 20  Temp:  98.1 F (36.7 C)  SpO2: 96%    SpO2 Readings from Last 1 Encounters:  09/24/17 96%   Body mass index is 34.77 kg/m.     Physical Exam  GENERAL APPEARANCE: Alert, conversant,  No acute distress.  SKIN: No diaphoresis rash HEAD: Normocephalic, atraumatic  EYES: Conjunctiva/lids clear. Pupils round, reactive. EOMs intact.  EARS: External exam WNL, canals clear. Hearing grossly normal.  NOSE: No deformity or discharge.  MOUTH/THROAT: Lips w/o lesions  RESPIRATORY: Breathing is even, unlabored. Lung sounds are clear   CARDIOVASCULAR: Heart RRR no murmurs, rubs or gallops. No peripheral edema.   GASTROINTESTINAL: Abdomen is soft, non-tender, not distended w/ normal bowel sounds; G-tube. GENITOURINARY: Bladder non tender, not distended  MUSCULOSKELETAL: No abnormal joints or musculature NEUROLOGIC:  Cranial nerves 2-12 grossly intact. Moves all extremities  PSYCHIATRIC: Mood and affect appropriate to situation, no behavioral issues  Patient Active Problem List   Diagnosis Date Noted  . Acute cholecystitis due to biliary calculus 12/17/2016  . Acute urinary retention 12/17/2016  . Acute renal failure superimposed on stage 3 chronic kidney disease (HCC) 12/17/2016  . Hyperchloremic acidosis 12/17/2016  . Hypomagnesemia 12/17/2016  . Pancytopenia (HCC) 12/17/2016  . Hyperlipidemia associated with type 2 diabetes mellitus (HCC) 12/17/2016  . Abnormal LFTs 11/28/2016  . Paroxysmal atrial fibrillation (HCC) 11/28/2016  . RLS (restless legs syndrome) 08/03/2016  . Restrictive lung disease 06/02/2016  . AKI (acute kidney injury) (HCC) 02/21/2016  . Uncontrolled type 2 diabetes mellitus with diabetic polyneuropathy, with long-term current use of insulin (HCC) 08/12/2015  . 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 Benign hypertension with chronic kidney disease, stage III (HCC) 06/14/2015  . Diabetes mellitus with stage 3 chronic kidney disease (HCC) 06/14/2015   . Vitamin D deficiency 06/14/2015  . CAD (coronary artery disease) 02/22/2015  . Chronic kidney disease 02/22/2015  . Obstructive sleep apnea 02/22/2015  . Old myocardial infarction 02/22/2015  . Right posterior capsular opacification 02/04/2015  . Pseudogout 12/25/2013  . Status post intraocular lens implant 05/15/2013  . Adjustment disorder with depressed mood 03/03/2013  . Acquired spondylolisthesis 09/14/2012  . Depressive disorder 09/14/2012  . Displacement of lumbar intervertebral disc 09/14/2012  . Morbid obesity (HCC) 09/14/2012  . Tear of medial cartilage or meniscus of knee, current 09/14/2012  . Diabetic macular edema (HCC) 09/12/2012  . Diabetic macular edema of both eyes with mild  nonproliferative retinopathy associated with type 2 diabetes mellitus (HCC) 08/01/2012      Labs reviewed: Basic Metabolic Panel:    Component Value Date/Time   NA 134 (A) 09/21/2017   K 4.5 09/21/2017   CL 103 11/27/2016 2035   CO2 19 (L) 11/27/2016 2035   GLUCOSE 487 (H) 11/27/2016 2035   BUN 37 (A) 09/21/2017   CREATININE 2.1 (A) 09/21/2017   CREATININE 2.09 (H) 11/27/2016 2035   CALCIUM 8.5 (L) 11/27/2016 2035   PROT 6.5 11/27/2016 2035   ALBUMIN 3.4 (L) 11/27/2016 2035   AST 36 09/08/2017   ALT 21 09/08/2017   ALKPHOS 170 (A) 09/08/2017   BILITOT 4.5 (H) 11/27/2016 2035   GFRNONAA 29 (L) 11/27/2016 2035   GFRAA 33 (L) 11/27/2016 2035    Recent Labs    11/27/16 2035 12/13/16 09/21/17  NA 134* 141 134*  K 4.8 4.1 4.5  CL 103  --   --   CO2 19*  --   --   GLUCOSE 487*  --   --   BUN 40* 20 37*  CREATININE 2.09* 1.6* 2.1*  CALCIUM 8.5*  --   --    Liver Function Tests: Recent Labs    11/27/16 2035 09/08/17  AST 108* 36  ALT 113* 21  ALKPHOS 254* 170*  BILITOT 4.5*  --   PROT 6.5  --   ALBUMIN 3.4*  --    Recent Labs    11/27/16 2035  LIPASE 26   No results for input(s): AMMONIA in the last 8760 hours. CBC: Recent Labs    11/27/16 2035 12/13/16  09/20/17  WBC 3.7* 5.3 9.1  NEUTROABS 2.8  --   --   HGB 9.7* 9.3* 10.1*  HCT 29.1* 30* 31*  MCV 87.1  --   --   PLT 95* 138* 201   Lipid No results for input(s): CHOL, HDL, LDLCALC, TRIG in the last 8760 hours.  Cardiac Enzymes: Recent Labs    11/27/16 2035  TROPONINI 0.06*   BNP: Recent Labs    11/27/16 2035  BNP 207.7*   No results found for: MICROALBUR No results found for: HGBA1C No results found for: TSH No results found for: VITAMINB12 No results found for: FOLATE No results found for: IRON, TIBC, FERRITIN  Imaging and Procedures obtained prior to SNF admission: Ct Abdomen Pelvis Wo Contrast  Result Date: 11/27/2016 CLINICAL DATA:  Diarrhea and vomiting EXAM: CT ABDOMEN AND PELVIS WITHOUT CONTRAST TECHNIQUE: Multidetector CT imaging of the abdomen and pelvis was performed following the standard protocol without IV contrast. COMPARISON:  None. FINDINGS: Lower chest: No pulmonary nodules or pleural effusion. No visible pericardial effusion. There are coronary artery calcifications. Hepatobiliary: Borderline hepatic steatosis. No biliary dilatation. The gallbladder is nondistended and contains hyperdense material. Pancreas: Normal contours without ductal dilatation. No peripancreatic fluid collection. Spleen: Normal. Adrenals/Urinary Tract: --Adrenal glands: Linear right adrenal gland is consistent with pelvic positioning of right kidney. --Right kidney/ureter: Pelvic right kidney.  No hydronephrosis. --Left kidney/ureter: No hydronephrosis or perinephric stranding. No nephrolithiasis. No obstructing ureteral stones. --Urinary bladder: Unremarkable. Stomach/Bowel: --Stomach/Duodenum: No hiatal hernia or other gastric abnormality. Normal duodenal course and caliber. --Small bowel: No dilatation or inflammation. --Colon: No focal abnormality. --Appendix: Normal. Vascular/Lymphatic: Atherosclerotic calcification is present within the non-aneurysmal abdominal aorta, without  hemodynamically significant stenosis. No abdominal or pelvic lymphadenopathy. Reproductive: Normal prostate and seminal vesicles. Musculoskeletal. 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 Other: None. IMPRESSION: 1. Nondistended gallbladder with  hyperdense contents. This may indicate cholelithiasis, but is indeterminate. Other considerations would include refluxed enteric contrast if the patient has a history of prior biliary intervention. The Right upper quadrant ultrasound is recommended for further characterization. 2. No colonic or enteric acute inflammation. 3. 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 5. Coronary artery and Aortic Atherosclerosis (ICD10-I70.0). Electronically Signed   By: Deatra Robinson M.D.   On: 11/27/2016 22:57   Dg Chest Port 1 View  Result Date: 11/27/2016 CLINICAL DATA:  Vomiting, elevated troponin. EXAM: PORTABLE CHEST 1 VIEW COMPARISON:  None. FINDINGS: Cardiac silhouette is moderately enlarged, mediastinal silhouette is unremarkable. Pulmonary vascular congestion and mild interstitial prominence. No pleural effusion or focal consolidation. No pneumothorax. Soft tissue planes and included osseous structures are nonsuspicious. IMPRESSION: Moderate cardiomegaly. Interstitial prominence most compatible with pulmonary edema. Electronically Signed   By: Awilda Metro M.D.   On: 11/27/2016 22:25   US Abdomen Limited Ruq  Result Date: 11/28/2016 CLINICAL DATA:  Elevated LFTs.  Right upper quadrant pain. EXAM: ULTRASOUND ABDOMEN LIMITED RIGHT UPPER QUADRANT COMPARISON:  CT abdomen/pelvis 2 hours prior. FINDINGS: Gallbladder: Contracted. Diffuse gallbladder wall thickening of 5 6 mm. Multiple gallstones in intraluminal sludge. Possible minimal pericholecystic fluid. A positive sonographic Eulah Pont sign was noted by sonographer. Common bile duct: Diameter: 6 mm, normal for age. Liver:  No focal lesion identified. Borderline increase in parenchymal echogenicity. Liver parenchyma is difficult to penetrate. Portal vein is patent on color Doppler imaging with normal direction of blood flow towards the liver. IMPRESSION: 1. Contracted gallbladder with gallstones, gallbladder wall thickening, and positive sonographic Murphy sign. Constellation of findings consistent with cholecystitis. This is likely acute, however there may be a degree of underlying chronic gallbladder inflammation given the contracted gallbladder. No biliary dilatation. 2. Borderline hepatic steatosis. Electronically Signed   By: Rubye Oaks M.D.   On: 11/28/2016 00:50     Not all labs, radiology exams or other studies done during hospitalization come through on my EPIC note; however they are reviewed by me.    Assessment and Plan  Acute on chronic respiratory failure with hypoxia and hypercapnia/OSA on BiPAP/acute metabolic encephalopathy -is post mechanical ventilation and extubated on 5/22; felt to be secondary to multiple problems including sedating medications including gabapentin Remeron fentanyl patch and Effexor; fentanyl was stopped on 5/29 and other doses were lowered; patient had been treated for aspiration pneumonia prior with 7 days of throat clearing; patient is on O2 nasal cannula at discharge SNF -admitted for OT/PT; continue O2 via nasal cannula and BiPAP at night  History of dysphasia- patient with failed speech pathology evaluation; initially patient had NG tube which she pulled out and family decided to proceed with PEG to patient which was placed on 6/11 SNF -continue feedings via PEG with free water flushes 200 mL every 4 hours  Diabetes mellitus type 2 with hypoglycemia-A1c 7.4; continue Lantus 20 units twice daily and Humalog sliding scale insulin every 6 hours  Acute on chronic renal failure stage III-improved with IV fluids SNF -follow-up BMP  Diastolic congestive heart failure SNF  -continue Coreg 6.25 mg twice daily, patient is currently off Lasix, will restart if necessary  Corneal abrasion,right SNF -noted by ophthalmologist for his status post bilateral blepharoplasty visit patient is to continue Cipro drops 4 times a day 4 times a day and both eyes at bedtime  GERD SNF -not stated as uncontrolled; continue Protonix 40 mg per 2  Hypertension  SNF -controlled; continue Coreg 6.25 PT twice daily Cardura 2 mg per tube daily and hydralazine 50 mg per tube every 8 hours   Time spent greater than 45 minutes;> 50% of time with patient was spent reviewing records, labs, tests and studies, counseling and developing plan of care  Thurston Hole D. Lyn Hollingshead, MD

## 2017-09-26 LAB — BASIC METABOLIC PANEL
BUN: 60 — AB (ref 4–21)
CREATININE: 2.17
Calcium: 8.8
GFR CALC NON AF AMER: 27.98
Glucose: 201
POTASSIUM: 5.2
Sodium: 135

## 2017-09-26 LAB — CBC
HCT: 26.9
HEMOGLOBIN: 8.8
MCV: 85.9
WBC: 5.6
platelet count: 140

## 2017-09-28 ENCOUNTER — Encounter: Payer: Self-pay | Admitting: *Deleted

## 2017-09-29 ENCOUNTER — Inpatient Hospital Stay (HOSPITAL_COMMUNITY)
Admission: EM | Admit: 2017-09-29 | Discharge: 2017-10-01 | DRG: 393 | Disposition: A | Discharge status: Skilled Nursing Facility | Payer: Medicare Other | Source: Skilled Nursing Facility | Attending: Internal Medicine | Admitting: Internal Medicine

## 2017-09-29 ENCOUNTER — Encounter (HOSPITAL_COMMUNITY): Payer: Self-pay | Admitting: Nurse Practitioner

## 2017-09-29 ENCOUNTER — Emergency Department (HOSPITAL_COMMUNITY): Payer: Medicare Other

## 2017-09-29 ENCOUNTER — Observation Stay (HOSPITAL_COMMUNITY): Payer: Medicare Other

## 2017-09-29 DIAGNOSIS — J9611 Chronic respiratory failure with hypoxia: Secondary | ICD-10-CM | POA: Diagnosis present

## 2017-09-29 DIAGNOSIS — Y731 Therapeutic (nonsurgical) and rehabilitative gastroenterology and urology devices associated with adverse incidents: Secondary | ICD-10-CM

## 2017-09-29 DIAGNOSIS — J9621 Acute and chronic respiratory failure with hypoxia: Secondary | ICD-10-CM | POA: Diagnosis present

## 2017-09-29 DIAGNOSIS — Z6829 Body mass index (BMI) 29.0-29.9, adult: Secondary | ICD-10-CM

## 2017-09-29 DIAGNOSIS — E1142 Type 2 diabetes mellitus with diabetic polyneuropathy: Secondary | ICD-10-CM | POA: Diagnosis present

## 2017-09-29 DIAGNOSIS — N183 Chronic kidney disease, stage 3 (moderate): Secondary | ICD-10-CM | POA: Diagnosis present

## 2017-09-29 DIAGNOSIS — I2510 Atherosclerotic heart disease of native coronary artery without angina pectoris: Secondary | ICD-10-CM | POA: Diagnosis present

## 2017-09-29 DIAGNOSIS — Z794 Long term (current) use of insulin: Secondary | ICD-10-CM

## 2017-09-29 DIAGNOSIS — Z833 Family history of diabetes mellitus: Secondary | ICD-10-CM

## 2017-09-29 DIAGNOSIS — J9622 Acute and chronic respiratory failure with hypercapnia: Secondary | ICD-10-CM | POA: Diagnosis not present

## 2017-09-29 DIAGNOSIS — Z9981 Dependence on supplemental oxygen: Secondary | ICD-10-CM

## 2017-09-29 DIAGNOSIS — G4733 Obstructive sleep apnea (adult) (pediatric): Secondary | ICD-10-CM | POA: Diagnosis not present

## 2017-09-29 DIAGNOSIS — E11311 Type 2 diabetes mellitus with unspecified diabetic retinopathy with macular edema: Secondary | ICD-10-CM | POA: Diagnosis present

## 2017-09-29 DIAGNOSIS — I4510 Unspecified right bundle-branch block: Secondary | ICD-10-CM | POA: Diagnosis present

## 2017-09-29 DIAGNOSIS — Z431 Encounter for attention to gastrostomy: Secondary | ICD-10-CM | POA: Diagnosis not present

## 2017-09-29 DIAGNOSIS — K9423 Gastrostomy malfunction: Secondary | ICD-10-CM | POA: Diagnosis present

## 2017-09-29 DIAGNOSIS — Z8249 Family history of ischemic heart disease and other diseases of the circulatory system: Secondary | ICD-10-CM

## 2017-09-29 DIAGNOSIS — Z66 Do not resuscitate: Secondary | ICD-10-CM | POA: Diagnosis present

## 2017-09-29 DIAGNOSIS — I480 Paroxysmal atrial fibrillation: Secondary | ICD-10-CM | POA: Diagnosis present

## 2017-09-29 DIAGNOSIS — N184 Chronic kidney disease, stage 4 (severe): Secondary | ICD-10-CM | POA: Diagnosis not present

## 2017-09-29 DIAGNOSIS — I5032 Chronic diastolic (congestive) heart failure: Secondary | ICD-10-CM | POA: Diagnosis present

## 2017-09-29 DIAGNOSIS — R1312 Dysphagia, oropharyngeal phase: Secondary | ICD-10-CM | POA: Diagnosis present

## 2017-09-29 DIAGNOSIS — K869 Disease of pancreas, unspecified: Secondary | ICD-10-CM | POA: Diagnosis present

## 2017-09-29 DIAGNOSIS — E669 Obesity, unspecified: Secondary | ICD-10-CM | POA: Diagnosis present

## 2017-09-29 DIAGNOSIS — Z955 Presence of coronary angioplasty implant and graft: Secondary | ICD-10-CM

## 2017-09-29 DIAGNOSIS — I482 Chronic atrial fibrillation: Secondary | ICD-10-CM | POA: Diagnosis present

## 2017-09-29 DIAGNOSIS — Z880 Allergy status to penicillin: Secondary | ICD-10-CM

## 2017-09-29 DIAGNOSIS — N189 Chronic kidney disease, unspecified: Secondary | ICD-10-CM | POA: Diagnosis present

## 2017-09-29 DIAGNOSIS — D631 Anemia in chronic kidney disease: Secondary | ICD-10-CM | POA: Diagnosis present

## 2017-09-29 DIAGNOSIS — E1122 Type 2 diabetes mellitus with diabetic chronic kidney disease: Secondary | ICD-10-CM | POA: Diagnosis present

## 2017-09-29 DIAGNOSIS — G9341 Metabolic encephalopathy: Secondary | ICD-10-CM | POA: Diagnosis not present

## 2017-09-29 DIAGNOSIS — R1310 Dysphagia, unspecified: Secondary | ICD-10-CM

## 2017-09-29 DIAGNOSIS — J9612 Chronic respiratory failure with hypercapnia: Secondary | ICD-10-CM | POA: Diagnosis present

## 2017-09-29 DIAGNOSIS — Z888 Allergy status to other drugs, medicaments and biological substances status: Secondary | ICD-10-CM

## 2017-09-29 DIAGNOSIS — I252 Old myocardial infarction: Secondary | ICD-10-CM

## 2017-09-29 DIAGNOSIS — Z87891 Personal history of nicotine dependence: Secondary | ICD-10-CM

## 2017-09-29 DIAGNOSIS — E875 Hyperkalemia: Secondary | ICD-10-CM | POA: Diagnosis present

## 2017-09-29 DIAGNOSIS — Z7982 Long term (current) use of aspirin: Secondary | ICD-10-CM

## 2017-09-29 DIAGNOSIS — I130 Hypertensive heart and chronic kidney disease with heart failure and stage 1 through stage 4 chronic kidney disease, or unspecified chronic kidney disease: Secondary | ICD-10-CM | POA: Diagnosis present

## 2017-09-29 DIAGNOSIS — K219 Gastro-esophageal reflux disease without esophagitis: Secondary | ICD-10-CM | POA: Diagnosis present

## 2017-09-29 DIAGNOSIS — R1319 Other dysphagia: Secondary | ICD-10-CM | POA: Diagnosis present

## 2017-09-29 DIAGNOSIS — Y92129 Unspecified place in nursing home as the place of occurrence of the external cause: Secondary | ICD-10-CM

## 2017-09-29 DIAGNOSIS — Z800 Family history of malignant neoplasm of digestive organs: Secondary | ICD-10-CM

## 2017-09-29 LAB — CBC WITH DIFFERENTIAL/PLATELET
BASOS PCT: 0 %
Basophils Absolute: 0 10*3/uL (ref 0.0–0.1)
Eosinophils Absolute: 0.2 10*3/uL (ref 0.0–0.7)
Eosinophils Relative: 3 %
HEMATOCRIT: 26.1 % — AB (ref 39.0–52.0)
HEMOGLOBIN: 8.1 g/dL — AB (ref 13.0–17.0)
Lymphocytes Relative: 15 %
Lymphs Abs: 0.9 10*3/uL (ref 0.7–4.0)
MCH: 27.7 pg (ref 26.0–34.0)
MCHC: 31 g/dL (ref 30.0–36.0)
MCV: 89.4 fL (ref 78.0–100.0)
MONOS PCT: 6 %
Monocytes Absolute: 0.4 10*3/uL (ref 0.1–1.0)
NEUTROS ABS: 4.9 10*3/uL (ref 1.7–7.7)
NEUTROS PCT: 76 %
Platelets: 160 10*3/uL (ref 150–400)
RBC: 2.92 MIL/uL — ABNORMAL LOW (ref 4.22–5.81)
RDW: 14.6 % (ref 11.5–15.5)
WBC: 6.4 10*3/uL (ref 4.0–10.5)

## 2017-09-29 LAB — COMPREHENSIVE METABOLIC PANEL
ALK PHOS: 198 U/L — AB (ref 38–126)
ALT: 48 U/L (ref 17–63)
ANION GAP: 6 (ref 5–15)
AST: 54 U/L — ABNORMAL HIGH (ref 15–41)
Albumin: 2.7 g/dL — ABNORMAL LOW (ref 3.5–5.0)
BUN: 53 mg/dL — ABNORMAL HIGH (ref 6–20)
CALCIUM: 8.9 mg/dL (ref 8.9–10.3)
CHLORIDE: 107 mmol/L (ref 101–111)
CO2: 27 mmol/L (ref 22–32)
Creatinine, Ser: 1.84 mg/dL — ABNORMAL HIGH (ref 0.61–1.24)
GFR calc non Af Amer: 33 mL/min — ABNORMAL LOW (ref >60)
GFR, EST AFRICAN AMERICAN: 39 mL/min — AB (ref >60)
Glucose, Bld: 159 mg/dL — ABNORMAL HIGH (ref 65–99)
Potassium: 5.3 mmol/L — ABNORMAL HIGH (ref 3.5–5.1)
SODIUM: 140 mmol/L (ref 135–145)
Total Bilirubin: 0.5 mg/dL (ref 0.3–1.2)
Total Protein: 6.4 g/dL — ABNORMAL LOW (ref 6.5–8.1)

## 2017-09-29 LAB — CBG MONITORING, ED: GLUCOSE-CAPILLARY: 148 mg/dL — AB (ref 65–99)

## 2017-09-29 LAB — GLUCOSE, CAPILLARY: Glucose-Capillary: 192 mg/dL — ABNORMAL HIGH (ref 65–99)

## 2017-09-29 MED ORDER — HYDRALAZINE HCL 20 MG/ML IJ SOLN: 10.0000 mg | INTRAMUSCULAR | Status: DC | PRN

## 2017-09-29 MED ORDER — ENOXAPARIN SODIUM 40 MG/0.4ML ~~LOC~~ SOLN
40.0000 mg | Freq: Every day | SUBCUTANEOUS | Status: DC
  Administered 2017-09-29 – 2017-09-30 (×2): 40 mg via SUBCUTANEOUS
  Filled 2017-09-29 (×2): qty 0.4

## 2017-09-29 MED ORDER — INSULIN ASPART 100 UNIT/ML ~~LOC~~ SOLN
0.0000 [IU] | Freq: Four times a day (QID) | SUBCUTANEOUS | Status: DC
  Administered 2017-09-29: 1 [IU] via SUBCUTANEOUS
  Administered 2017-09-30: 3 [IU] via SUBCUTANEOUS
  Administered 2017-09-30: 5 [IU] via SUBCUTANEOUS
  Administered 2017-09-30: 1 [IU] via SUBCUTANEOUS
  Administered 2017-10-01 (×2): 5 [IU] via SUBCUTANEOUS
  Administered 2017-10-01: 6 [IU] via SUBCUTANEOUS

## 2017-09-29 MED ORDER — METOPROLOL TARTRATE 5 MG/5ML IV SOLN: 2.5000 mg | INTRAVENOUS | Status: DC | PRN

## 2017-09-29 MED ORDER — ONDANSETRON HCL 4 MG PO TABS: 4.0000 mg | ORAL_TABLET | Freq: Four times a day (QID) | ORAL | Status: DC | PRN

## 2017-09-29 MED ORDER — STERILE WATER FOR INJECTION IJ SOLN
INTRAMUSCULAR | Status: AC
  Administered 2017-09-29: 18:00:00
  Filled 2017-09-29: qty 10

## 2017-09-29 MED ORDER — SODIUM CHLORIDE 0.9 % IV SOLN
INTRAVENOUS | Status: DC
  Administered 2017-09-29: 22:00:00 via INTRAVENOUS

## 2017-09-29 MED ORDER — ACETAMINOPHEN 650 MG RE SUPP: 650.0000 mg | Freq: Four times a day (QID) | RECTAL | Status: DC | PRN

## 2017-09-29 MED ORDER — ACETAMINOPHEN 325 MG PO TABS: 650.0000 mg | ORAL_TABLET | Freq: Four times a day (QID) | ORAL | Status: DC | PRN

## 2017-09-29 MED ORDER — POLYVINYL ALCOHOL 1.4 % OP SOLN
1.0000 [drp] | OPHTHALMIC | Status: DC | PRN
Start: 2017-09-29 — End: 2017-10-01
  Filled 2017-09-29: qty 15

## 2017-09-29 MED ORDER — SODIUM CHLORIDE 0.9% FLUSH
3.0000 mL | Freq: Two times a day (BID) | INTRAVENOUS | Status: DC
  Administered 2017-09-30: 3 mL via INTRAVENOUS

## 2017-09-29 MED ORDER — IPRATROPIUM-ALBUTEROL 0.5-2.5 (3) MG/3ML IN SOLN: 3.0000 mL | RESPIRATORY_TRACT | Status: DC | PRN

## 2017-09-29 MED ORDER — ONDANSETRON HCL 4 MG/2ML IJ SOLN: 4.0000 mg | Freq: Four times a day (QID) | INTRAMUSCULAR | Status: DC | PRN

## 2017-09-29 NOTE — ED Triage Notes (Signed)
Pt is presented by EMS from [facility], reportedly the G-Tube came out as staff were doing peri care on pt. He is agitated something EMS states that's baseline secondary to hx of dementia.

## 2017-09-29 NOTE — ED Provider Notes (Signed)
[facility]-EMERGENCY DEPT Provider Note   CSN: [id number] Arrival date & time: 09/29/17  1635     History   Chief Complaint No chief complaint on file.   HPI Eric Huerta is a 77 y.o. male.  The history is provided by the nursing home and the EMS personnel. No language interpreter was used.   Eric Huerta is a 77 y.o. male who presents to the Emergency Department complaining of feeding tube dislodged.  Level V caveat due to AMS.  Hx is provided by EMS and Nursing Home staff.  He presents for pulled PEG tube. He was getting changed around 3 PM when his peg tube was pulled out. Records reviewed in care everywhere. He was recently hospitalized at [facility] and had a peg tube placed on June 11 due to dysphasia. Past Medical History:  Diagnosis Date  . Abnormal LFTs 11/28/2016  . Acquired spondylolisthesis 09/14/2012  . Adjustment disorder with depressed mood 03/03/2013  . AKI (acute kidney injury) (HCC) 02/21/2016  . 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 Back pain   . Benign hypertension with chronic kidney disease, stage III (HCC) 06/14/2015  . CAD (coronary artery disease) 02/22/2015  . Chronic kidney disease 02/22/2015  . Depressive disorder 09/14/2012  . Diabetes mellitus with stage 3 chronic kidney disease (HCC) 06/14/2015  . Diabetes mellitus without complication (HCC)   . Diabetic macular edema (HCC) 09/12/2012  . 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 Displacement of lumbar intervertebral disc 09/14/2012  . GERD (gastroesophageal reflux disease)   . High cholesterol   . Hypertension   . Morbid obesity (HCC) 09/14/2012  . Obesity   . Obstructive sleep apnea 02/22/2015  . Old myocardial infarction 02/22/2015  . Paroxysmal atrial fibrillation (HCC) 11/28/2016  . Pseudogout 12/25/2013   Overview:  Based on Right  olecranon bursa synovial fluid analysis  . Renal disorder   . Restrictive lung disease 06/02/2016  . Right posterior capsular opacification 02/04/2015  . RLS (restless legs syndrome) 08/03/2016  . Status post intraocular lens implant 05/15/2013  . Tear of medial cartilage or meniscus of knee, current 09/14/2012  . Uncontrolled type 2 diabetes mellitus with diabetic polyneuropathy, with long-term current use of insulin (HCC) 08/12/2015  . Vitamin D deficiency 06/14/2015    Patient Active Problem List   Diagnosis Date Noted  . Acute cholecystitis due to biliary calculus 12/17/2016  . Acute urinary retention 12/17/2016  . Acute renal failure superimposed on stage 3 chronic kidney disease (HCC) 12/17/2016  . Hyperchloremic acidosis 12/17/2016  . Hypomagnesemia 12/17/2016  . Pancytopenia (HCC) 12/17/2016  . Hyperlipidemia associated with type 2 diabetes mellitus (HCC) 12/17/2016  . Abnormal LFTs 11/28/2016  . Paroxysmal atrial fibrillation (HCC) 11/28/2016  . RLS (restless legs syndrome) 08/03/2016  . Restrictive lung disease 06/02/2016  . AKI (acute kidney injury) (HCC) 02/21/2016  . Uncontrolled type 2 diabetes mellitus with diabetic polyneuropathy, with long-term current use of insulin (HCC) 08/12/2015  . 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 Benign hypertension with chronic kidney disease, stage III (HCC) 06/14/2015  . Diabetes mellitus with stage 3 chronic kidney disease (HCC) 06/14/2015  . Vitamin D deficiency 06/14/2015  . CAD (coronary artery disease) 02/22/2015  . Chronic kidney disease 02/22/2015  . Obstructive sleep apnea 02/22/2015  . Old myocardial infarction 02/22/2015  . Right posterior capsular opacification  02/04/2015  . Pseudogout 12/25/2013  . Status post intraocular lens implant 05/15/2013  . Adjustment disorder with depressed mood 03/03/2013  . Acquired spondylolisthesis 09/14/2012  . Depressive disorder 09/14/2012  . Displacement of lumbar  intervertebral disc 09/14/2012  . Morbid obesity (HCC) 09/14/2012  . Tear of medial cartilage or meniscus of knee, current 09/14/2012  . Diabetic macular edema (HCC) 09/12/2012  . 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 CARDIAC SURGERY  2000   stents placed in heart  . KNEE SURGERY Left 09/14/2012  . LUMBAR DISC SURGERY          Home Medications    Prior to Admission medications   Medication Sig Start Date End Date Taking? Authorizing Provider  acetaminophen (TYLENOL) 160 MG/5ML suspension Place 650 mg into feeding tube every 6 (six) hours as needed for fever.    [provider]  albuterol (PROVENTIL HFA;VENTOLIN HFA) 108 (90 Base) MCG/ACT inhaler Inhale 2 puffs into the lungs every 6 (six) hours as needed for wheezing.    [provider]  aspirin 81 MG chewable tablet Place 81 mg into feeding tube daily.     [provider]  atorvastatin (LIPITOR) 40 MG tablet Place 40 mg into feeding tube at bedtime.    [provider]  brimonidine (ALPHAGAN) 0.2 % ophthalmic solution Place 1 drop into both eyes 3 (three) times daily.    [provider]  carvedilol (COREG) 6.25 MG tablet Place 6.25 mg into feeding tube 2 (two) times daily with a meal.     [provider]  Cholecalciferol 2000 units CAPS Place 1 capsule into feeding tube daily.    [provider]  ciprofloxacin (CILOXAN) 0.3 % ophthalmic solution Place 2 drops into the right eye every 4 (four) hours while awake. Administer 1 drop, every 2 hours, while awake, for 2 days. Then 1 drop, every 4 hours, while awake, for the next 5 days.    [provider]  docusate (COLACE) 50 MG/5ML liquid Place 100 mg into feeding tube 2 (two) times daily.    [provider]  doxazosin (CARDURA) 2 MG tablet Place 2 mg into  feeding tube daily.     [provider]  erythromycin ophthalmic ointment Place 1 application into the right eye 4 (four) times daily. And instil into both eyes at bedtime    [provider]  folic acid (FOLVITE) 1 MG tablet Place 1 mg into feeding tube daily.    [provider]  hydrALAZINE (APRESOLINE) 50 MG tablet Place 50 mg into feeding tube every 8 (eight) hours.    [provider]  insulin glargine (LANTUS) 100 UNIT/ML injection Inject 20 Units into the skin 2 (two) times daily.     [provider]  Insulin Lispro (HUMALOG KWIKPEN [place]) Inject 1-8 Units into the skin every 6 (six) hours.     [provider]  Lidocaine-Menthol 4-4 % PTCH Apply 1-3 patches topically daily as needed.    [provider]  nitroGLYCERIN (NITROSTAT) 0.4 MG SL tablet Place 0.4 mg under the tongue. Place one SL tablet under the tongue every 5 minutes as needed for chest pain for 3 doses    [provider]  pantoprazole sodium (PROTONIX) 40 mg/20 mL PACK Place 40 mg into feeding tube daily.    [provider]  polyvinyl alcohol (LIQUIFILM  TEARS) 1.4 % ophthalmic solution Place 1 drop into both eyes as needed for dry eyes.    [provider]  polyvinyl alcohol (LIQUIFILM TEARS) 1.4 % ophthalmic solution Place 1 drop into both eyes as needed for dry eyes.    [provider]  thiamine 100 MG tablet Place 100 mg into feeding tube daily.    [provider]    Family History Family History  Problem Relation Age of Onset  . Diabetes Mother   . Hypertension Mother   . Hypertension Father   . Stroke Father   . Cancer Sister        bowel  . Diabetes Maternal Grandmother     Social History Social History   Tobacco Use  . Smoking status: Former Smoker    Last attempt to quit: 09/28/2016    Years since quitting: 1.0  . Smokeless tobacco: Never Used  Substance Use Topics  . Alcohol use: No  . Drug use: No      Allergies   Lyrica [pregabalin]; Norvasc [amlodipine besylate]; and Penicillins   Review of Systems Review of Systems  Unable to perform ROS: Patient nonverbal     Physical Exam Updated Vital Signs BP (!) 159/69 (BP Location: Right Arm)   Pulse 76   Temp 97.8 F (36.6 C) (Oral)   Resp 12   SpO2 100%   Physical Exam  Constitutional: He appears well-developed and well-nourished.  HENT:  Head: Normocephalic and atraumatic.  Cardiovascular: Normal rate and regular rhythm.  No murmur heard. Pulmonary/Chest: Effort normal and breath sounds normal. No respiratory distress.  Abdominal: Soft. There is no rebound and no guarding.  1cm wound in epigastrium with mild local tenderness.    Musculoskeletal: He exhibits no edema or tenderness.  Neurological: He is alert.  Nonverbal.  Does not follow commands.  Moves all four extremities.    Skin: Skin is warm and dry.  Psychiatric: He has a normal mood and affect. His behavior is normal.  Nursing note and vitals reviewed.    ED Treatments / Results  Labs (all labs ordered are listed, but only abnormal results are displayed) Labs Reviewed  COMPREHENSIVE METABOLIC PANEL - Abnormal; Notable for the following components:      Result Value   Potassium 5.3 (*)    Glucose, Bld 159 (*)    BUN 53 (*)    Creatinine, Ser 1.84 (*)    Total Protein 6.4 (*)    Albumin 2.7 (*)    AST 54 (*)    Alkaline Phosphatase 198 (*)    GFR calc non Af Amer 33 (*)    GFR calc Af Amer 39 (*)    All other components within normal limits  CBC WITH DIFFERENTIAL/PLATELET - Abnormal; Notable for the following components:   RBC 2.92 (*)    Hemoglobin 8.1 (*)    HCT 26.1 (*)    All other components within normal limits  CBG MONITORING, ED - Abnormal; Notable for the following components:   Glucose-Capillary 148 (*)    All other components within normal limits    EKG None  Radiology Ct Abdomen Pelvis Wo Contrast  Result Date:  09/29/2017 CLINICAL DATA:  reportedly the G-Tube came out as staff were doing peri care on pt. He is agitated something EMS states that's baseline secondary to hx of dementia. EXAM: CT ABDOMEN AND PELVIS WITHOUT CONTRAST TECHNIQUE: Multidetector CT imaging of the abdomen and pelvis was performed following the standard protocol without IV contrast.  COMPARISON:  None. FINDINGS: Lower chest: Heart is mildly enlarged. Mild lung base scarring and/or subsegmental atelectasis. No acute findings. Is Hepatobiliary: Liver normal in overall size. No liver mass or focal lesion. There are morphologic changes with central volume loss and relative enlargement of the lateral segment left lobe that raises possibility of cirrhosis. Gallbladder surgically absent. No bile duct dilation. Pancreas: 16 mm hypoattenuating mass arises from pancreatic tail. This is not clearly cystic and incompletely characterized on this unenhanced study. No other pancreatic masses. No pancreatic inflammation. Spleen: Spleen borderline enlarged measuring 12 cm in long axis. No splenic masses or focal lesions. Adrenals/Urinary Tract: No adrenal masses. Right kidney is inferiorly located. It demonstrates a lobulated contour. There is no defined right renal mass. No collecting system stone. No hydronephrosis. Left kidney is normal in size and position. No left renal mass, stone or hydronephrosis. Ureters are normal in course and in caliber. Bladder is distended.  No bladder wall thickening, masses or stones. Stomach/Bowel: Anterior aspect of the stomach is retracted against the anterior peritoneal cavity adjacent to a gastrostomy tract. There is no extraluminal air or fluid. Stomach otherwise unremarkable. Bowel is normal in caliber. No bowel wall thickening or inflammatory changes. Vascular/Lymphatic: There several prominent gastrohepatic ligament lymph nodes, none pathologically enlarged by size criteria. No enlarged lymph nodes. Dense atherosclerotic  calcifications are noted along the abdominal aorta and its branch vessels. No aneurysm. Reproductive: Unremarkable. Other: No fluid collection is seen along the gastrostomy tube tract. No abdominal wall hernia. No ascites. No evidence of an abdominal abscess. Musculoskeletal: L5 is a transitional lumbosacral vertebra. 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 No acute fracture. No osteoblastic or osteolytic lesions. IMPRESSION: 1. No acute findings within the abdomen or pelvis. 2. The gastrostomy tract is visualized. There is no free intraperitoneal air, ascites or fluid collection to suggest a complication related to the gastrostomy tube. 3. 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 Borderline splenomegaly. 4. Small hypoattenuating mass measuring 1.6 cm arises from the pancreatic tail. This could reflect a neoplasm. It could be cystic but is not well-defined on this unenhanced study. Recommend follow-up CT in 6 months, with contrast if the patient can tolerate contrast. 5. Dense aortic atherosclerosis. 6. Ptotic right kidney. No hydronephrosis or acute renal abnormality. 7. Distended bladder. 8. 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 Electronically Signed   By: Amie Portland M.D.   On: 09/29/2017 18:39    Procedures Procedures (including critical care time)  Medications Ordered in ED Medications  sterile water (preservative free) injection (  Given by Other 09/29/17 1745)     Initial Impression / Assessment and Plan / ED Course  I have reviewed the triage vital signs and the nursing notes.  Pertinent labs & imaging results that were available during my care of the patient were reviewed by me and considered in my medical decision making (see chart for details).     patient here for evaluation of peg tube being accidentally removed. He does have some mild tenderness on his abdominal wall.  Attempted to replace with [address] [place] but no tract can be palpated.  D/w GI - recommends IR. D/w IR - recommends observation, CT abd w/o contrast,  Will attempt to place in morning.    Hospitalist consulted for admission for observation and peg tube replacement.   Discussed treatment plan with patient's wife, Peyton Spengler.  [phone]  Final Clinical Impressions(s) / ED Diagnoses   Final diagnoses:  PEG tube malfunction [facility])    ED Discharge Orders    None       Tilden Fossa, MD 09/29/17 2008

## 2017-09-29 NOTE — ED Notes (Signed)
ED TO INPATIENT HANDOFF REPORT  Name/Age/Gender Eric Huerta 77 y.o. male  Code Status    Code Status Orders  (From admission, onward)        Start     Ordered   09/29/17 2021  Do not attempt resuscitation (DNR)  Continuous    Question Answer Comment  In the event of cardiac or respiratory ARREST Do not call a "code blue"   In the event of cardiac or respiratory ARREST Do not perform Intubation, CPR, defibrillation or ACLS   In the event of cardiac or respiratory ARREST Use medication by any route, position, wound care, and other measures to relive pain and suffering. May use oxygen, suction and manual treatment of airway obstruction as needed for comfort.      09/29/17 2020    Code Status History    Date Active Date Inactive Code Status Order ID Comments User Context   09/29/2017 2009 09/29/2017 2020 Full Code [id number]  Norval Morton, MD ED      Home/SNF/Other Nursing Home  Chief Complaint Pulled GI tube  Level of Care/Admitting Diagnosis ED Disposition    ED Disposition Condition [place] [facility] Area: [facility] [[id number]]  Level of Care: Telemetry [5]  Admit to tele based on following criteria: Complex arrhythmia (Bradycardia/Tachycardia)  Diagnosis: Malfunction of percutaneous endoscopic gastrostomy (PEG) tube [facility]) [790383]  Admitting Physician: Norval Morton [[id number]]  Attending Physician: Norval Morton [[id number]]  PT Class (Do Not Modify): Observation [104]  PT Acc Code (Do Not Modify): Observation [10022]       Medical History Past Medical History:  Diagnosis Date  . Abnormal LFTs 11/28/2016  . Acquired spondylolisthesis 09/14/2012  . Adjustment disorder with depressed mood 03/03/2013  . AKI (acute kidney injury) ([place]) 02/21/2016  . Anemia due to stage 3 chronic kidney disease ([place]) 06/14/2015  . Back pain   . Benign hypertension with chronic kidney disease, stage III ([place]) 06/14/2015  . CAD (coronary artery  disease) 02/22/2015  . Chronic kidney disease 02/22/2015  . Depressive disorder 09/14/2012  . Diabetes mellitus with stage 3 chronic kidney disease ([place]) 06/14/2015  . Diabetes mellitus without complication ([place])   . Diabetic macular edema ([place]) 09/12/2012  . 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 Displacement of lumbar intervertebral disc 09/14/2012  . GERD (gastroesophageal reflux disease)   . High cholesterol   . Hypertension   . Morbid obesity ([place]) 09/14/2012  . Obesity   . Obstructive sleep apnea 02/22/2015  . Old myocardial infarction 02/22/2015  . Paroxysmal atrial fibrillation ([place]) 11/28/2016  . Pseudogout 12/25/2013   Overview:  Based on Right olecranon bursa synovial fluid analysis  . Renal disorder   . Restrictive lung disease 06/02/2016  . Right posterior capsular opacification 02/04/2015  . RLS (restless legs syndrome) 08/03/2016  . Status post intraocular lens implant 05/15/2013  . Tear of medial cartilage or meniscus of knee, current 09/14/2012  . Uncontrolled type 2 diabetes mellitus with diabetic polyneuropathy, with long-term current use of insulin ([place]) 08/12/2015  . Vitamin D deficiency 06/14/2015    Allergies Allergies  Allergen Reactions  . Lyrica [Pregabalin]   . Norvasc [Amlodipine Besylate]   . Penicillins     IV Location/Drains/Wounds Patient Lines/Drains/Airways Status   Active Line/Drains/Airways    Name:   Placement date:   Placement time:   Site:   Days:   Peripheral IV 09/29/17  Right;Posterior Hand   09/29/17    1912    Hand   less than 1          Labs/Imaging Results for orders placed or performed during the hospital encounter of 09/29/17 (from the past 48 hour(s))  Comprehensive metabolic panel     Status: Abnormal   Collection Time: 09/29/17  6:39 PM  Result Value Ref Range   Sodium 140 135 - 145 mmol/L   Potassium 5.3 (H) 3.5 - 5.1 mmol/L   Chloride 107  101 - 111 mmol/L   CO2 27 22 - 32 mmol/L   Glucose, Bld 159 (H) 65 - 99 mg/dL   BUN 53 (H) 6 - 20 mg/dL   Creatinine, Ser 1.84 (H) 0.61 - 1.24 mg/dL   Calcium 8.9 8.9 - 10.3 mg/dL   Total Protein 6.4 (L) 6.5 - 8.1 g/dL   Albumin 2.7 (L) 3.5 - 5.0 g/dL   AST 54 (H) 15 - 41 U/L   ALT 48 17 - 63 U/L   Alkaline Phosphatase 198 (H) 38 - 126 U/L   Total Bilirubin 0.5 0.3 - 1.2 mg/dL   GFR calc non Af Amer 33 (L) >60 mL/min   GFR calc Af Amer 39 (L) >60 mL/min    Comment: (NOTE) The eGFR has been calculated using the CKD EPI equation. This calculation has not been validated in all clinical situations. eGFR's persistently <60 mL/min signify possible Chronic Kidney Disease.    Anion gap 6 5 - 15    Comment: Performed at [facility], [place] [address] [place], [place] [address]  CBC with Differential     Status: Abnormal   Collection Time: 09/29/17  6:39 PM  Result Value Ref Range   WBC 6.4 4.0 - 10.5 K/uL   RBC 2.92 (L) 4.22 - 5.81 MIL/uL   Hemoglobin 8.1 (L) 13.0 - 17.0 g/dL   HCT 26.1 (L) 39.0 - 52.0 %   MCV 89.4 78.0 - 100.0 fL   MCH 27.7 26.0 - 34.0 pg   MCHC 31.0 30.0 - 36.0 g/dL   RDW 14.6 11.5 - 15.5 %   Platelets 160 150 - 400 K/uL   Neutrophils Relative % 76 %   Neutro Abs 4.9 1.7 - 7.7 K/uL   Lymphocytes Relative 15 %   Lymphs Abs 0.9 0.7 - 4.0 K/uL   Monocytes Relative 6 %   Monocytes Absolute 0.4 0.1 - 1.0 K/uL   Eosinophils Relative 3 %   Eosinophils Absolute 0.2 0.0 - 0.7 K/uL   Basophils Relative 0 %   Basophils Absolute 0.0 0.0 - 0.1 K/uL    Comment: Performed at [facility], [place] [address], [place], [place] [address]  CBG monitoring, ED     Status: Abnormal   Collection Time: 09/29/17  6:49 PM  Result Value Ref Range   Glucose-Capillary 148 (H) 65 - 99 mg/dL   Ct Abdomen Pelvis Wo Contrast  Result Date: 09/29/2017 CLINICAL DATA:  reportedly the G-Tube came out as staff were doing peri care on pt. He is agitated  something EMS states that's baseline secondary to hx of dementia. EXAM: CT ABDOMEN AND PELVIS WITHOUT CONTRAST TECHNIQUE: Multidetector CT imaging of the abdomen and pelvis was performed following the standard protocol without IV contrast. COMPARISON:  None. FINDINGS: Lower chest: Heart is mildly enlarged. Mild lung base scarring and/or subsegmental atelectasis. No acute findings. Is Hepatobiliary: Liver normal in overall size. No liver mass or focal lesion. There are morphologic  changes with central volume loss and relative enlargement of the lateral segment left lobe that raises possibility of cirrhosis. Gallbladder surgically absent. No bile duct dilation. Pancreas: 16 mm hypoattenuating mass arises from pancreatic tail. This is not clearly cystic and incompletely characterized on this unenhanced study. No other pancreatic masses. No pancreatic inflammation. Spleen: Spleen borderline enlarged measuring 12 cm in long axis. No splenic masses or focal lesions. Adrenals/Urinary Tract: No adrenal masses. Right kidney is inferiorly located. It demonstrates a lobulated contour. There is no defined right renal mass. No collecting system stone. No hydronephrosis. Left kidney is normal in size and position. No left renal mass, stone or hydronephrosis. Ureters are normal in course and in caliber. Bladder is distended.  No bladder wall thickening, masses or stones. Stomach/Bowel: Anterior aspect of the stomach is retracted against the anterior peritoneal cavity adjacent to a gastrostomy tract. There is no extraluminal air or fluid. Stomach otherwise unremarkable. Bowel is normal in caliber. No bowel wall thickening or inflammatory changes. Vascular/Lymphatic: There several prominent gastrohepatic ligament lymph nodes, none pathologically enlarged by size criteria. No enlarged lymph nodes. Dense atherosclerotic calcifications are noted along the abdominal aorta and its branch vessels. No aneurysm. Reproductive: Unremarkable.  Other: No fluid collection is seen along the gastrostomy tube tract. No abdominal wall hernia. No ascites. No evidence of an abdominal abscess. Musculoskeletal: L5 is a transitional lumbosacral vertebra. 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 No acute fracture. No osteoblastic or osteolytic lesions. IMPRESSION: 1. No acute findings within the abdomen or pelvis. 2. The gastrostomy tract is visualized. There is no free intraperitoneal air, ascites or fluid collection to suggest a complication related to the gastrostomy tube. 3. 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 Borderline splenomegaly. 4. Small hypoattenuating mass measuring 1.6 cm arises from the pancreatic tail. This could reflect a neoplasm. It could be cystic but is not well-defined on this unenhanced study. Recommend follow-up CT in 6 months, with contrast if the patient can tolerate contrast. 5. Dense aortic atherosclerosis. 6. Ptotic right kidney. No hydronephrosis or acute renal abnormality. 7. Distended bladder. 8. 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 Electronically Signed   By: Lajean Manes M.D.   On: 09/29/2017 18:39    Pending Labs Unresulted Labs (From admission, onward)   Start     Ordered   09/30/17 0500  CBC  Tomorrow morning,   R     09/29/17 2009   09/30/17 1497  Basic metabolic panel  Tomorrow morning,   R     09/29/17 2009   09/30/17 0500  Protime-INR  Tomorrow morning,   R     09/29/17 2009   09/30/17 0500  APTT  Tomorrow morning,   R     09/29/17 2009      Vitals/Pain Today's Vitals   09/29/17 1742 09/29/17 1930 09/29/17 1954  BP: 94/79 (!) 159/69 (!) 159/69  Pulse: 71 72 76  Resp: 17  12  Temp: 97.8 F (36.6 C)    TempSrc: Oral    SpO2: 100% 96% 100%    Isolation Precautions No active isolations  Medications Medications  polyvinyl alcohol (LIQUIFILM TEARS) 1.4 % ophthalmic solution 1 drop (has  no administration in time range)  insulin aspart (novoLOG) injection 0-5 Units (has no administration in time range)  enoxaparin (LOVENOX) injection 40 mg (has no administration in time range)  sodium chloride flush (NS) 0.9 %  injection 3 mL (has no administration in time range)  0.9 %  sodium chloride infusion (has no administration in time range)  ondansetron (ZOFRAN) tablet 4 mg (has no administration in time range)    Or  ondansetron (ZOFRAN) injection 4 mg (has no administration in time range)  acetaminophen (TYLENOL) tablet 650 mg (has no administration in time range)    Or  acetaminophen (TYLENOL) suppository 650 mg (has no administration in time range)  ipratropium-albuterol (DUONEB) 0.5-2.5 (3) MG/3ML nebulizer solution 3 mL (has no administration in time range)  metoprolol tartrate (LOPRESSOR) injection 2.5 mg (has no administration in time range)  hydrALAZINE (APRESOLINE) injection 10 mg (has no administration in time range)  sterile water (preservative free) injection (  Given by Other 09/29/17 1745)

## 2017-09-29 NOTE — H&P (Signed)
History and Physical    Eric Huerta ZOX:[id number] DOB: 07-17-1940 DOA: 09/29/2017  Referring MD/NP/PA: Sandria Senter PCP: Andreas Blower., MD  Patient coming from:[facility]  via EMS  Chief Complaint:  dislodgepeg tube  I have personally briefly reviewed patient's old medical records in [facility]   HPI: Eric Huerta is a 77 y.o. male with medical history significant of diastolic CHF, a. fib, chronic respiratory failure, dysphagia s/p PEG, CKD stage III-IV, DM type II, OSA on BiPAP; who presents after having accidental dislodgment of his PEG tube at the nursing facility while he was being changed at approximately 1500.  Patient unable to provide history as he is nonverbal with altered mental status.  Review of records on care everywhere shows the patient was hospitalized recently at [facility] who initially presented for unresponsiveness.  During that hospitalization the PEG tube placed on 6/11, due to recurrent aspiration pneumonia and dysphagia with failed speech evaluation.    ED Course: Upon admission into the emergency department patient was noted to have stable vital signs.  Labs revealed WBC 6.4, hemoglobin 8.1, potassium 5.3, BUN 53, creatinine 1.84, Albumin 2.7 AP.  CT imaging of the abdomen pelvis showed chronic changes suggestive of a liver cirrhosis and a 1.6 cm mass at the pancreatic tail which could represent neoplasm.  Dr. Katherina Right interventional radiology was consulted and will see the patient in a.m. for replacement of PEG tube.  TRH called to admit for observation.  Review of Systems  Unable to perform ROS: Patient nonverbal    Past Medical History:  Diagnosis Date  . Abnormal LFTs 11/28/2016  . Acquired spondylolisthesis 09/14/2012  . Adjustment disorder with depressed mood 03/03/2013  . AKI (acute kidney injury) (HCC) 02/21/2016  . 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 Back pain   . Benign  hypertension with chronic kidney disease, stage III (HCC) 06/14/2015  . CAD (coronary artery disease) 02/22/2015  . Chronic kidney disease 02/22/2015  . Depressive disorder 09/14/2012  . Diabetes mellitus with stage 3 chronic kidney disease (HCC) 06/14/2015  . Diabetes mellitus without complication (HCC)   . Diabetic macular edema (HCC) 09/12/2012  . 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 Displacement of lumbar intervertebral disc 09/14/2012  . GERD (gastroesophageal reflux disease)   . High cholesterol   . Hypertension   . Morbid obesity (HCC) 09/14/2012  . Obesity   . Obstructive sleep apnea 02/22/2015  . Old myocardial infarction 02/22/2015  . Paroxysmal atrial fibrillation (HCC) 11/28/2016  . Pseudogout 12/25/2013   Overview:  Based on Right olecranon bursa synovial fluid analysis  . Renal disorder   . Restrictive lung disease 06/02/2016  . Right posterior capsular opacification 02/04/2015  . RLS (restless legs syndrome) 08/03/2016  . Status post intraocular lens implant 05/15/2013  . Tear of medial cartilage or meniscus of knee, current 09/14/2012  . Uncontrolled type 2 diabetes mellitus with diabetic polyneuropathy, with long-term current use of insulin (HCC) 08/12/2015  . Vitamin D deficiency 06/14/2015    Past Surgical History:  Procedure Laterality Date  . CARDIAC SURGERY  2000   stents placed in heart  . KNEE SURGERY Left 09/14/2012  . LUMBAR DISC SURGERY       reports that he quit smoking about a year ago. He has never used smokeless tobacco. He reports that he does not drink alcohol or use  drugs.  Allergies  Allergen Reactions  . Lyrica [Pregabalin]   . Norvasc [Amlodipine Besylate]   . Penicillins     Family History  Problem Relation Age of Onset  . Diabetes Mother   . Hypertension Mother   . Hypertension Father   . Stroke Father   . Cancer Sister        bowel  . Diabetes  Maternal Grandmother     Prior to Admission medications   Medication Sig Start Date End Date Taking? Authorizing Provider  acetaminophen (TYLENOL) 160 MG/5ML suspension Place 650 mg into feeding tube every 6 (six) hours as needed for fever.    [provider]  albuterol (PROVENTIL HFA;VENTOLIN HFA) 108 (90 Base) MCG/ACT inhaler Inhale 2 puffs into the lungs every 6 (six) hours as needed for wheezing.    [provider]  aspirin 81 MG chewable tablet Place 81 mg into feeding tube daily.     [provider]  atorvastatin (LIPITOR) 40 MG tablet Place 40 mg into feeding tube at bedtime.    [provider]  brimonidine (ALPHAGAN) 0.2 % ophthalmic solution Place 1 drop into both eyes 3 (three) times daily.    [provider]  carvedilol (COREG) 6.25 MG tablet Place 6.25 mg into feeding tube 2 (two) times daily with a meal.     [provider]  Cholecalciferol 2000 units CAPS Place 1 capsule into feeding tube daily.    [provider]  ciprofloxacin (CILOXAN) 0.3 % ophthalmic solution Place 2 drops into the right eye every 4 (four) hours while awake. Administer 1 drop, every 2 hours, while awake, for 2 days. Then 1 drop, every 4 hours, while awake, for the next 5 days.    [provider]  docusate (COLACE) 50 MG/5ML liquid Place 100 mg into feeding tube 2 (two) times daily.    [provider]  doxazosin (CARDURA) 2 MG tablet Place 2 mg into feeding tube daily.     [provider]  erythromycin ophthalmic ointment Place 1 application into the right eye 4 (four) times daily. And instil into both eyes at bedtime    [provider]  folic acid (FOLVITE) 1 MG tablet Place 1 mg into feeding tube daily.    [provider]  hydrALAZINE (APRESOLINE) 50 MG tablet Place 50 mg into feeding tube every 8 (eight) hours.    [provider]  insulin glargine (LANTUS) 100 UNIT/ML injection Inject 20 Units  into the skin 2 (two) times daily.     [provider]  Insulin Lispro (HUMALOG KWIKPEN [place]) Inject 1-8 Units into the skin every 6 (six) hours.     [provider]  Lidocaine-Menthol 4-4 % PTCH Apply 1-3 patches topically daily as needed.    [provider]  nitroGLYCERIN (NITROSTAT) 0.4 MG SL tablet Place 0.4 mg under the tongue. Place one SL tablet under the tongue every 5 minutes as needed for chest pain for 3 doses    [provider]  pantoprazole sodium (PROTONIX) 40 mg/20 mL PACK Place 40 mg into feeding tube daily.    [provider]  polyvinyl alcohol (LIQUIFILM TEARS) 1.4 % ophthalmic solution Place 1 drop into both eyes as needed for dry eyes.    [provider]  polyvinyl alcohol (LIQUIFILM TEARS) 1.4 % ophthalmic solution Place 1 drop into both eyes as needed for dry eyes.    [provider]  thiamine 100 MG tablet Place 100 mg into feeding  tube daily.    [provider]    Physical Exam:  Constitutional: Obese male who opens eyes, but does not follow commands Vitals:   09/29/17 1742  BP: 94/79  Pulse: 71  Resp: 17  Temp: 97.8 F (36.6 C)  TempSrc: Oral  SpO2: 100%   Eyes: Mild bilateral lid edema. ENMT: Mucous membranes are dry. Posterior pharynx clear of any exudate or lesions. Poor  dentition.  Neck: normal, supple, no masses, no thyromegaly Respiratory: clear to auscultation bilaterally, no wheezing, no crackles. Normal respiratory effort. No accessory muscle use.  Cardiovascular: Irregular rhythm, no murmurs / rubs / gallops.  Trace to 1+ pitting extremity edema noted of the bilateral lower extremities. 2+ pedal pulses. No carotid bruits.  Abdomen: no tenderness, no masses palpated. No hepatosplenomegaly. Bowel sounds positive.  Musculoskeletal: no clubbing / cyanosis. No joint deformity upper and lower extremities. Good ROM, no contractures. Normal muscle tone.  Skin: no rashes, lesions, ulcers. No  induration Neurologic: Patient spontaneous move extremities Psychiatric: Unable to assess as patient is nonverbal and unable to follow commands.    Labs on Admission: I have personally reviewed following labs and imaging studies  CBC: Recent Labs  Lab 09/26/17 09/29/17 1839  WBC 5.6 6.4  NEUTROABS  --  4.9  HGB 8.8 8.1*  HCT 26.9 26.1*  MCV 85.9 89.4  PLT  --  160   Basic Metabolic Panel: Recent Labs  Lab 09/26/17 09/29/17 1839  NA 135 140  K 5.2 5.3*  CL  --  107  CO2  --  27  GLUCOSE  --  159*  BUN 60* 53*  CREATININE 2.17 1.84*  CALCIUM 8.8 8.9   GFR: Estimated Creatinine Clearance: 37.4 mL/min (A) (by C-G formula based on SCr of 1.84 mg/dL (H)). Liver Function Tests: Recent Labs  Lab 09/29/17 1839  AST 54*  ALT 48  ALKPHOS 198*  BILITOT 0.5  PROT 6.4*  ALBUMIN 2.7*   No results for input(s): LIPASE, AMYLASE in the last 168 hours. No results for input(s): AMMONIA in the last 168 hours. Coagulation Profile: No results for input(s): INR, PROTIME in the last 168 hours. Cardiac Enzymes: No results for input(s): CKTOTAL, CKMB, CKMBINDEX, TROPONINI in the last 168 hours. BNP (last 3 results) No results for input(s): PROBNP in the last 8760 hours. HbA1C: No results for input(s): HGBA1C in the last 72 hours. CBG: Recent Labs  Lab 09/29/17 1849  GLUCAP 148*   Lipid Profile: No results for input(s): CHOL, HDL, LDLCALC, TRIG, CHOLHDL, LDLDIRECT in the last 72 hours. Thyroid Function Tests: No results for input(s): TSH, T4TOTAL, FREET4, T3FREE, THYROIDAB in the last 72 hours. Anemia Panel: No results for input(s): VITAMINB12, FOLATE, FERRITIN, TIBC, IRON, RETICCTPCT in the last 72 hours. Urine analysis:    Component Value Date/Time   COLORURINE AMBER (A) 11/27/2016 2230   APPEARANCEUR CLEAR 11/27/2016 2230   LABSPEC 1.016 11/27/2016 2230   PHURINE 5.0 11/27/2016 2230   GLUCOSEU >=500 (A) 11/27/2016 2230   HGBUR NEGATIVE 11/27/2016 2230   BILIRUBINUR  SMALL (A) 11/27/2016 2230   KETONESUR NEGATIVE 11/27/2016 2230   PROTEINUR NEGATIVE 11/27/2016 2230   NITRITE NEGATIVE 11/27/2016 2230   LEUKOCYTESUR NEGATIVE 11/27/2016 2230   Sepsis Labs: No results found for this or any previous visit (from the past 240 hour(s)).   Radiological Exams on Admission: Ct Abdomen Pelvis Wo Contrast  Result Date: 09/29/2017 CLINICAL DATA:  reportedly the G-Tube came out as staff were doing peri care on pt.  He is agitated something EMS states that's baseline secondary to hx of dementia. EXAM: CT ABDOMEN AND PELVIS WITHOUT CONTRAST TECHNIQUE: Multidetector CT imaging of the abdomen and pelvis was performed following the standard protocol without IV contrast. COMPARISON:  None. FINDINGS: Lower chest: Heart is mildly enlarged. Mild lung base scarring and/or subsegmental atelectasis. No acute findings. Is Hepatobiliary: Liver normal in overall size. No liver mass or focal lesion. There are morphologic changes with central volume loss and relative enlargement of the lateral segment left lobe that raises possibility of cirrhosis. Gallbladder surgically absent. No bile duct dilation. Pancreas: 16 mm hypoattenuating mass arises from pancreatic tail. This is not clearly cystic and incompletely characterized on this unenhanced study. No other pancreatic masses. No pancreatic inflammation. Spleen: Spleen borderline enlarged measuring 12 cm in long axis. No splenic masses or focal lesions. Adrenals/Urinary Tract: No adrenal masses. Right kidney is inferiorly located. It demonstrates a lobulated contour. There is no defined right renal mass. No collecting system stone. No hydronephrosis. Left kidney is normal in size and position. No left renal mass, stone or hydronephrosis. Ureters are normal in course and in caliber. Bladder is distended.  No bladder wall thickening, masses or stones. Stomach/Bowel: Anterior aspect of the stomach is retracted against the anterior peritoneal cavity  adjacent to a gastrostomy tract. There is no extraluminal air or fluid. Stomach otherwise unremarkable. Bowel is normal in caliber. No bowel wall thickening or inflammatory changes. Vascular/Lymphatic: There several prominent gastrohepatic ligament lymph nodes, none pathologically enlarged by size criteria. No enlarged lymph nodes. Dense atherosclerotic calcifications are noted along the abdominal aorta and its branch vessels. No aneurysm. Reproductive: Unremarkable. Other: No fluid collection is seen along the gastrostomy tube tract. No abdominal wall hernia. No ascites. No evidence of an abdominal abscess. Musculoskeletal: L5 is a transitional lumbosacral vertebra. 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 No acute fracture. No osteoblastic or osteolytic lesions. IMPRESSION: 1. No acute findings within the abdomen or pelvis. 2. The gastrostomy tract is visualized. There is no free intraperitoneal air, ascites or fluid collection to suggest a complication related to the gastrostomy tube. 3. 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 Borderline splenomegaly. 4. Small hypoattenuating mass measuring 1.6 cm arises from the pancreatic tail. This could reflect a neoplasm. It could be cystic but is not well-defined on this unenhanced study. Recommend follow-up CT in 6 months, with contrast if the patient can tolerate contrast. 5. Dense aortic atherosclerosis. 6. Ptotic right kidney. No hydronephrosis or acute renal abnormality. 7. Distended bladder. 8. 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 Electronically Signed   By: Amie Portlandavid  Ormond M.D.   On: 09/29/2017 18:39    EKG: Independently reviewed.  Atrial fibrillation at 68 bpm  Assessment/Plan Dislodged PEG tube, oral pharyngeal dysphasia: Patient with recent placement of PEG tube 6/11 at [facility] for recurrent aspiration pneumonia.  Patient apparently  dislodge PEG prior to admission.  IR consulted and will see patient in a.m. for attempted replacement. - Admit to a telemetry - Dr. Katherina RightJay Watts of interventional radiology consulted, will follow-up for attempt of replacement of PEG tube in a.m.  Chronic respiratory failure with hypoxia and hypercapnia, OSA on BiPAP: Patient currently utilizes BiPAP at night and nasal cannula oxygen 24/7 since previous hospitalization. - Continuous pulse oximetry overnight with nasal cannula oxygen - BiPAP nightly per RT   Encephalopathy with persistent neurological deficits: Patient appears to  be currently aphasic. Review of care everywhere records shows he was worked up at [facility] by neurology during his last hospitalization March to June.  MRI did not show any acute abnormalities.  However, the EEG was noted to be moderate encephalopathy which may be the result of  medication effects, metabolic abnormalities or neurodegenerative disorders.  Patient still not readily following commands, but this may be patient's new baseline. - Add on ammoniaas patient had clinical signs of cirrhosis seen on CT imaging of the abdomen  Paroxysmal atrial fibrillation: Patient normally on Coreg for rate control. - Overnight we will utilize low-dose metoprolol IV as needed for elevated - Restart Coreg once PEG tube able to be replaced  Hyperkalemia: Acute.  Initial potassium 5.3 on admission.  No significant EKG changes. - Gentle IV fluid hydration overnight   Anemia of chronic disease: Hemoglobin noted to be 8.1 on admission.  Patient's baseline hemoglobin appears to be 8 -9. - Continue to monitor  Chronic diastolic heart failure: Patient does not appear to be grossly fluid overloaded.  Last echocardiogram from 08/28/2017 showed EF noted to be 55 to 60% with mild diastolic dysfunction. - Strict intake and output and daily weights  Diabetes mellitus type 2  - Hypoglycemic protocols - Hold home Lantus dose as tube  feeds currently on hold - CBGs every 6 hours with custom SSI  Chronic kidney disease stageIII/ IV: Stable.  Baseline creatinine appears to be approximately 2.  Patient presents with creatinine of 1.84 with BUN 53. - Recheck kidney function in a.m.  Pancreatic mass: Incidental finding on CT scan of the abdomen revealed 1.6 cm mass at the tail of the pancreas for which malignancy cannot be excluded. - Recommended 6 month follow-up CT  DVT prophylaxis: lovenox  Code Status: DNR Family Communication: No family present at bedside Disposition Plan: To be discharged back to skilled nursing facility once medically stable Consults called: Interventional radiology Admission status: observation   Clydie Braun MD [facility] Pager [phone]   If 7PM-7AM, please contact night-coverage [email or web address] Password [facility]  09/29/2017, 7:36 PM

## 2017-09-29 NOTE — ED Notes (Signed)
Bed: [id number] Expected date:  Expected time:  Means of arrival:  Comments: [facility] nursing home

## 2017-09-30 ENCOUNTER — Encounter (HOSPITAL_COMMUNITY): Payer: Self-pay | Admitting: Interventional Radiology

## 2017-09-30 ENCOUNTER — Observation Stay (HOSPITAL_COMMUNITY): Payer: Medicare Other

## 2017-09-30 DIAGNOSIS — J9612 Chronic respiratory failure with hypercapnia: Secondary | ICD-10-CM | POA: Diagnosis present

## 2017-09-30 DIAGNOSIS — J9611 Chronic respiratory failure with hypoxia: Secondary | ICD-10-CM | POA: Diagnosis present

## 2017-09-30 DIAGNOSIS — I482 Chronic atrial fibrillation: Secondary | ICD-10-CM | POA: Diagnosis present

## 2017-09-30 DIAGNOSIS — G4733 Obstructive sleep apnea (adult) (pediatric): Secondary | ICD-10-CM

## 2017-09-30 DIAGNOSIS — Y92129 Unspecified place in nursing home as the place of occurrence of the external cause: Secondary | ICD-10-CM | POA: Diagnosis not present

## 2017-09-30 DIAGNOSIS — N184 Chronic kidney disease, stage 4 (severe): Secondary | ICD-10-CM | POA: Diagnosis not present

## 2017-09-30 DIAGNOSIS — J9622 Acute and chronic respiratory failure with hypercapnia: Secondary | ICD-10-CM

## 2017-09-30 DIAGNOSIS — R1310 Dysphagia, unspecified: Secondary | ICD-10-CM | POA: Diagnosis not present

## 2017-09-30 DIAGNOSIS — E1122 Type 2 diabetes mellitus with diabetic chronic kidney disease: Secondary | ICD-10-CM | POA: Diagnosis present

## 2017-09-30 DIAGNOSIS — I2510 Atherosclerotic heart disease of native coronary artery without angina pectoris: Secondary | ICD-10-CM | POA: Diagnosis present

## 2017-09-30 DIAGNOSIS — Z431 Encounter for attention to gastrostomy: Secondary | ICD-10-CM | POA: Diagnosis present

## 2017-09-30 DIAGNOSIS — K219 Gastro-esophageal reflux disease without esophagitis: Secondary | ICD-10-CM | POA: Diagnosis present

## 2017-09-30 DIAGNOSIS — E11311 Type 2 diabetes mellitus with unspecified diabetic retinopathy with macular edema: Secondary | ICD-10-CM | POA: Diagnosis present

## 2017-09-30 DIAGNOSIS — I480 Paroxysmal atrial fibrillation: Secondary | ICD-10-CM | POA: Diagnosis present

## 2017-09-30 DIAGNOSIS — D631 Anemia in chronic kidney disease: Secondary | ICD-10-CM | POA: Diagnosis present

## 2017-09-30 DIAGNOSIS — E1142 Type 2 diabetes mellitus with diabetic polyneuropathy: Secondary | ICD-10-CM | POA: Diagnosis present

## 2017-09-30 DIAGNOSIS — Y731 Therapeutic (nonsurgical) and rehabilitative gastroenterology and urology devices associated with adverse incidents: Secondary | ICD-10-CM | POA: Diagnosis not present

## 2017-09-30 DIAGNOSIS — Z800 Family history of malignant neoplasm of digestive organs: Secondary | ICD-10-CM | POA: Diagnosis not present

## 2017-09-30 DIAGNOSIS — R1312 Dysphagia, oropharyngeal phase: Secondary | ICD-10-CM | POA: Diagnosis present

## 2017-09-30 DIAGNOSIS — I252 Old myocardial infarction: Secondary | ICD-10-CM | POA: Diagnosis not present

## 2017-09-30 DIAGNOSIS — J9621 Acute and chronic respiratory failure with hypoxia: Secondary | ICD-10-CM

## 2017-09-30 DIAGNOSIS — R1319 Other dysphagia: Secondary | ICD-10-CM | POA: Diagnosis present

## 2017-09-30 DIAGNOSIS — I4510 Unspecified right bundle-branch block: Secondary | ICD-10-CM | POA: Diagnosis present

## 2017-09-30 DIAGNOSIS — I130 Hypertensive heart and chronic kidney disease with heart failure and stage 1 through stage 4 chronic kidney disease, or unspecified chronic kidney disease: Secondary | ICD-10-CM | POA: Diagnosis present

## 2017-09-30 DIAGNOSIS — E875 Hyperkalemia: Secondary | ICD-10-CM | POA: Diagnosis present

## 2017-09-30 DIAGNOSIS — N183 Chronic kidney disease, stage 3 (moderate): Secondary | ICD-10-CM | POA: Diagnosis present

## 2017-09-30 DIAGNOSIS — I5032 Chronic diastolic (congestive) heart failure: Secondary | ICD-10-CM | POA: Diagnosis present

## 2017-09-30 DIAGNOSIS — K869 Disease of pancreas, unspecified: Secondary | ICD-10-CM | POA: Diagnosis present

## 2017-09-30 DIAGNOSIS — K9423 Gastrostomy malfunction: Secondary | ICD-10-CM | POA: Diagnosis not present

## 2017-09-30 DIAGNOSIS — G9341 Metabolic encephalopathy: Secondary | ICD-10-CM | POA: Diagnosis present

## 2017-09-30 DIAGNOSIS — Z66 Do not resuscitate: Secondary | ICD-10-CM | POA: Diagnosis present

## 2017-09-30 HISTORY — PX: IR REPLC GASTRO/COLONIC TUBE PERCUT W/FLUORO: IMG2333

## 2017-09-30 LAB — BASIC METABOLIC PANEL
ANION GAP: 9 (ref 5–15)
BUN: 52 mg/dL — ABNORMAL HIGH (ref 6–20)
CHLORIDE: 108 mmol/L (ref 101–111)
CO2: 26 mmol/L (ref 22–32)
Calcium: 8.9 mg/dL (ref 8.9–10.3)
Creatinine, Ser: 1.92 mg/dL — ABNORMAL HIGH (ref 0.61–1.24)
GFR, EST AFRICAN AMERICAN: 37 mL/min — AB (ref >60)
GFR, EST NON AFRICAN AMERICAN: 32 mL/min — AB (ref >60)
Glucose, Bld: 194 mg/dL — ABNORMAL HIGH (ref 65–99)
POTASSIUM: 5.3 mmol/L — AB (ref 3.5–5.1)
SODIUM: 143 mmol/L (ref 135–145)

## 2017-09-30 LAB — MRSA PCR SCREENING: MRSA BY PCR: POSITIVE — AB

## 2017-09-30 LAB — GLUCOSE, CAPILLARY
GLUCOSE-CAPILLARY: 226 mg/dL — AB (ref 65–99)
Glucose-Capillary: 192 mg/dL — ABNORMAL HIGH (ref 65–99)
Glucose-Capillary: 254 mg/dL — ABNORMAL HIGH (ref 65–99)

## 2017-09-30 LAB — CBC
HCT: 30.3 % — ABNORMAL LOW (ref 39.0–52.0)
HEMOGLOBIN: 9.5 g/dL — AB (ref 13.0–17.0)
MCH: 27.7 pg (ref 26.0–34.0)
MCHC: 31.4 g/dL (ref 30.0–36.0)
MCV: 88.3 fL (ref 78.0–100.0)
PLATELETS: 151 10*3/uL (ref 150–400)
RBC: 3.43 MIL/uL — AB (ref 4.22–5.81)
RDW: 14.7 % (ref 11.5–15.5)
WBC: 7.8 10*3/uL (ref 4.0–10.5)

## 2017-09-30 LAB — AMMONIA: AMMONIA: 23 umol/L (ref 9–35)

## 2017-09-30 LAB — PROTIME-INR
INR: 1.15
PROTHROMBIN TIME: 14.6 s (ref 11.4–15.2)

## 2017-09-30 LAB — APTT: aPTT: 31 seconds (ref 24–36)

## 2017-09-30 MED ORDER — CHOLECALCIFEROL 50 MCG (2000 UT) PO CHEW: 2000.0000 [IU] | CHEWABLE_TABLET | Freq: Every day | ORAL | Status: DC

## 2017-09-30 MED ORDER — ALBUTEROL SULFATE HFA 108 (90 BASE) MCG/ACT IN AERS: 2.0000 | INHALATION_SPRAY | Freq: Four times a day (QID) | RESPIRATORY_TRACT | Status: DC | PRN

## 2017-09-30 MED ORDER — CARVEDILOL 6.25 MG PO TABS
6.2500 mg | ORAL_TABLET | Freq: Two times a day (BID) | ORAL | Status: DC
  Administered 2017-09-30 – 2017-10-01 (×3): 6.25 mg
  Filled 2017-09-30 (×3): qty 1

## 2017-09-30 MED ORDER — GLUCERNA 1.2 CAL PO LIQD
1000.0000 mL | ORAL | Status: DC
  Administered 2017-09-30: 1000 mL
  Filled 2017-09-30 (×3): qty 1000

## 2017-09-30 MED ORDER — IOPAMIDOL (ISOVUE-300) INJECTION 61%
50.0000 mL | Freq: Once | INTRAVENOUS | Status: AC | PRN
  Administered 2017-09-30: 15 mL

## 2017-09-30 MED ORDER — BRIMONIDINE TARTRATE 0.2 % OP SOLN
1.0000 [drp] | Freq: Three times a day (TID) | OPHTHALMIC | Status: DC
  Administered 2017-09-30 – 2017-10-01 (×4): 1 [drp] via OPHTHALMIC
  Filled 2017-09-30: qty 5

## 2017-09-30 MED ORDER — HYDRALAZINE HCL 50 MG PO TABS
50.0000 mg | ORAL_TABLET | Freq: Three times a day (TID) | ORAL | Status: DC
  Administered 2017-09-30 – 2017-10-01 (×4): 50 mg
  Filled 2017-09-30 (×4): qty 1

## 2017-09-30 MED ORDER — ERYTHROMYCIN 5 MG/GM OP OINT
1.0000 "application " | TOPICAL_OINTMENT | Freq: Four times a day (QID) | OPHTHALMIC | Status: DC
  Administered 2017-09-30 – 2017-10-01 (×5): 1 via OPHTHALMIC
  Filled 2017-09-30: qty 1

## 2017-09-30 MED ORDER — ATORVASTATIN CALCIUM 40 MG PO TABS
40.0000 mg | ORAL_TABLET | Freq: Every day | ORAL | Status: DC
  Administered 2017-09-30: 40 mg
  Filled 2017-09-30: qty 1

## 2017-09-30 MED ORDER — MUPIROCIN 2 % EX OINT
1.0000 "application " | TOPICAL_OINTMENT | Freq: Two times a day (BID) | CUTANEOUS | Status: DC
  Administered 2017-09-30 – 2017-10-01 (×3): 1 via NASAL
  Filled 2017-09-30: qty 22

## 2017-09-30 MED ORDER — GLUCERNA 1.2 CAL PO LIQD: 1000.0000 mL | ORAL | Status: DC

## 2017-09-30 MED ORDER — ACETAMINOPHEN 160 MG/5ML PO SOLN
650.0000 mg | Freq: Four times a day (QID) | ORAL | Status: DC | PRN
  Filled 2017-09-30: qty 20.3

## 2017-09-30 MED ORDER — DOCUSATE SODIUM 50 MG/5ML PO LIQD
100.0000 mg | Freq: Two times a day (BID) | ORAL | Status: DC
  Administered 2017-09-30 – 2017-10-01 (×2): 100 mg
  Filled 2017-09-30 (×4): qty 10

## 2017-09-30 MED ORDER — ASPIRIN 81 MG PO CHEW
81.0000 mg | CHEWABLE_TABLET | Freq: Every day | ORAL | Status: DC
  Administered 2017-09-30 – 2017-10-01 (×2): 81 mg
  Filled 2017-09-30 (×2): qty 1

## 2017-09-30 MED ORDER — IOPAMIDOL (ISOVUE-300) INJECTION 61%
INTRAVENOUS | Status: AC
  Administered 2017-09-30: 15 mL
  Filled 2017-09-30: qty 50

## 2017-09-30 MED ORDER — CHLORHEXIDINE GLUCONATE CLOTH 2 % EX PADS
6.0000 | MEDICATED_PAD | Freq: Every day | CUTANEOUS | Status: DC
  Administered 2017-10-01: 6 via TOPICAL

## 2017-09-30 MED ORDER — GLUCERNA 1.5 CAL PO LIQD
1000.0000 mL | ORAL | Status: DC
  Filled 2017-09-30: qty 1000

## 2017-09-30 MED ORDER — LIDOCAINE VISCOUS HCL 2 % MT SOLN
OROMUCOSAL | Status: AC
  Filled 2017-09-30: qty 15

## 2017-09-30 MED ORDER — DOXAZOSIN MESYLATE 2 MG PO TABS
2.0000 mg | ORAL_TABLET | Freq: Every day | ORAL | Status: DC
  Administered 2017-09-30 – 2017-10-01 (×2): 2 mg
  Filled 2017-09-30 (×2): qty 1

## 2017-09-30 MED ORDER — PANTOPRAZOLE SODIUM 40 MG PO PACK
40.0000 mg | PACK | Freq: Every day | ORAL | Status: DC
  Administered 2017-09-30 – 2017-10-01 (×2): 40 mg
  Filled 2017-09-30 (×2): qty 20

## 2017-09-30 MED ORDER — CIPROFLOXACIN HCL 0.3 % OP SOLN
1.0000 [drp] | OPHTHALMIC | Status: DC
  Administered 2017-09-30 – 2017-10-01 (×6): 1 [drp] via OPHTHALMIC
  Filled 2017-09-30: qty 2.5

## 2017-09-30 MED ORDER — FOLIC ACID 1 MG PO TABS
1.0000 mg | ORAL_TABLET | Freq: Every day | ORAL | Status: DC
  Administered 2017-09-30 – 2017-10-01 (×2): 1 mg
  Filled 2017-09-30 (×2): qty 1

## 2017-09-30 MED ORDER — VITAMIN B-1 100 MG PO TABS
100.0000 mg | ORAL_TABLET | Freq: Every day | ORAL | Status: DC
  Administered 2017-09-30 – 2017-10-01 (×2): 100 mg
  Filled 2017-09-30 (×2): qty 1

## 2017-09-30 MED ORDER — VITAMIN D3 25 MCG (1000 UNIT) PO TABS
2000.0000 [IU] | ORAL_TABLET | Freq: Every day | ORAL | Status: DC
Start: 2017-09-30 — End: 2017-10-01
  Administered 2017-09-30 – 2017-10-01 (×2): 2000 [IU]
  Filled 2017-09-30 (×2): qty 2

## 2017-09-30 NOTE — Progress Notes (Signed)
MEDICATION-RELATED CONSULT NOTE   IR Procedure Consult - Anticoagulant/Antiplatelet PTA/Inpatient Med List Review by Pharmacist    Procedure: REPLC GASTRO/COLONIC TUBE PERCUT W/FLUORO     Completed: 11:30  Post-Procedural bleeding risk per IR MD assessment:  Low  Antithrombotic medications on inpatient or PTA profile prior to procedure:       Recommended restart time per IR Post-Procedure Guidelines:  Day 0 at least 4 hours or at next standard dose interval    Other considerations:      Plan:   Continue Lovenox 40mg  SQ q24h at 22:00 tonight  Loralee PacasErin Ante Arredondo, PharmD, BCPS Pager: [phone] 09/30/2017 11:35 AM

## 2017-09-30 NOTE — Procedures (Signed)
Pre procedural Dx: Dysphagia. Post procedural Dx: Same  Successful fluoroscopic guided replacement of exisitng 18 Fr gastrostomy tube.   The feeding tube is ready for immediate use.  EBL: None  Complications: None immediate.  Katherina RightJay Elen Acero, MD Pager #: [phone]

## 2017-09-30 NOTE — Progress Notes (Signed)
Patient MRSA PCR positive. Per protocol, Bactroban and CHG wipes initiated. Unable to inform patient d/t confusion but will alert family when present.

## 2017-09-30 NOTE — Progress Notes (Signed)
PROGRESS NOTE    Eric Huerta  VWU:[id number] DOB: 11-30-1940 DOA: 09/29/2017 PCP: Andreas Blower., MD    Brief Narrative:  77 year old male who presented with dislodged feeding tube.  He does have the past medical history for diastolic heart failure, chronic atrial fibrillation, chronic kidney disease, type 2 diabetes mellitus, PEG tube.  Apparently he is a nursing home resident, per nursing patient is not fully verbal.  On his initial physical examination his blood pressure was 94/79, heart rate 71, respiratory rate 17, temperature 97.8, oxygen saturation 100%,, moist mucous membranes, lungs clear to auscultation bilaterally, heart S1-S2 present irregularly irregular, abdomen soft nontender, no lower extremity edema.  Sodium 140, potassium 5.3, chloride 107, bicarb 27, glucose 159, BUN 53, creatinine 1.84, hemoglobin 8.1, hematocrit 26.1, platelets 160.  CT of the abdomen had no acute findings.  Gastrostomy tract visualized, no free intraperitoneal air, ascites or fluid collection.  Chest x-ray with left rotation, positive cardiomegaly, no infiltrates.  EKG sinus rhythm, left axis deviation, right bundle branch block.  Admitted with a working diagnosis of swallow dysfunction status post dislodged feeding tube.  Assessment & Plan:   Principal Problem:   Malfunction of percutaneous endoscopic gastrostomy (PEG) tube (HCC) Active Problems:   Chronic kidney disease   Diabetes mellitus with stage 3 chronic kidney disease (HCC)   Obstructive sleep apnea   Paroxysmal atrial fibrillation (HCC)   Acute on chronic respiratory failure with hypoxia and hypercapnia (HCC)   Acute metabolic encephalopathy   1. Dislodged Peg tube. Patient with chronic dysphagia, tube has been replaced by IR, will add abdominal binder to protect tube, and will resume tube feedings. Consult nutrition for assessment. Continue aspiration precautions. Plan to discharge in am if tolerates well tube feedings.   2. Chronic  encephalopathy. Patient is not verbal, non focal. Will continue neuro checks per unit protocol. Resume feedings per peg tube.   3. Paroxysmal atrial fibrillation. Will resume b blockade with carvedilol per tube, rate [place] controlled. Will dc telemetry for now.   4. CKD stage 3 to 4 with hyperkalemia. Will hold on IV fluids, will resume eneteral feedings and will follow on renal panel in am, avoid hypotension or nephrotoxic medications.  5. T2DM. Will continue glucose cover and monitoring with insulin sliding scale, resume tube feedings. Continue to hold on long acting insulin for now to prevent hypoglycemia.   6. Pancreatic mass. Will need outpatient follow up.   DVT prophylaxis: enoxaparin   Code Status: dnr Family Communication: no family at the bedside  Disposition Plan: discharge in am, if tolerates tube feeding, and chronic medical problems stable including renal function and electrolytes.    Consultants:   IR  Procedures:   Replacement peg tube  Antimicrobials:       Subjective: Patient is not verbal, not in apparent distress, no dyspnea or chest pain. Tube has been replaced.   Objective: Vitals:   09/29/17 1954 09/29/17 2121 09/30/17 0624 09/30/17 0625  BP: (!) 159/69 (!) 175/70 (!) 162/77   Pulse: 76 68 69   Resp: 12 18 18    Temp:  98.3 F (36.8 C) 99.4 F (37.4 C)   TempSrc:  Oral Oral   SpO2: 100% 100% 100%   Weight:    94.3 kg (207 lb 14.3 oz)  Height:    5\' 10"  (1.778 m)    Intake/Output Summary (Last 24 hours) at 09/30/2017 1158 Last data filed at 09/30/2017 0820 Gross per 24 hour  Intake 702.5 ml  Output -  Net 702.5 ml   Filed Weights   09/30/17 0625  Weight: 94.3 kg (207 lb 14.3 oz)    Examination:   General: Not in pain or dyspnea, deconditioned  Neurology: Awake, moves all 4 extremities.  E ENT: no pallor, no icterus, oral mucosa moist Cardiovascular: No JVD. S1-S2 present, rhythmic, no gallops, rubs, or murmurs. No lower extremity  edema. Pulmonary: vesicular breath sounds bilaterally, adequate air movement, no wheezing, rhonchi or rales. Gastrointestinal. Abdomen protuberant with peg tube in place, no organomegaly, non tender, no rebound or guarding Skin. No rashes Musculoskeletal: no joint deformities     Data Reviewed: I have personally reviewed following labs and imaging studies  CBC: Recent Labs  Lab 09/26/17 09/29/17 1839 09/30/17 0403  WBC 5.6 6.4 7.8  NEUTROABS  --  4.9  --   HGB 8.8 8.1* 9.5*  HCT 26.9 26.1* 30.3*  MCV 85.9 89.4 88.3  PLT  --  160 151   Basic Metabolic Panel: Recent Labs  Lab 09/26/17 09/29/17 1839 09/30/17 0403  NA 135 140 143  K 5.2 5.3* 5.3*  CL  --  107 108  CO2  --  27 26  GLUCOSE  --  159* 194*  BUN 60* 53* 52*  CREATININE 2.17 1.84* 1.92*  CALCIUM 8.8 8.9 8.9   GFR: Estimated Creatinine Clearance: 36.6 mL/min (A) (by C-G formula based on SCr of 1.92 mg/dL (H)). Liver Function Tests: Recent Labs  Lab 09/29/17 1839  AST 54*  ALT 48  ALKPHOS 198*  BILITOT 0.5  PROT 6.4*  ALBUMIN 2.7*   No results for input(s): LIPASE, AMYLASE in the last 168 hours. Recent Labs  Lab 09/30/17 0003  AMMONIA 23   Coagulation Profile: Recent Labs  Lab 09/30/17 0403  INR 1.15   Cardiac Enzymes: No results for input(s): CKTOTAL, CKMB, CKMBINDEX, TROPONINI in the last 168 hours. BNP (last 3 results) No results for input(s): PROBNP in the last 8760 hours. HbA1C: No results for input(s): HGBA1C in the last 72 hours. CBG: Recent Labs  Lab 09/29/17 1849 09/29/17 2254 09/30/17 0622 09/30/17 1138  GLUCAP 148* 192* 192* 226*   Lipid Profile: No results for input(s): CHOL, HDL, LDLCALC, TRIG, CHOLHDL, LDLDIRECT in the last 72 hours. Thyroid Function Tests: No results for input(s): TSH, T4TOTAL, FREET4, T3FREE, THYROIDAB in the last 72 hours. Anemia Panel: No results for input(s): VITAMINB12, FOLATE, FERRITIN, TIBC, IRON, RETICCTPCT in the last 72  hours.    Radiology Studies: I have reviewed all of the imaging during this hospital visit personally     Scheduled Meds: . enoxaparin (LOVENOX) injection  40 mg Subcutaneous QHS  . insulin aspart  0-5 Units Subcutaneous Q6H  . lidocaine      . sodium chloride flush  3 mL Intravenous Q12H   Continuous Infusions: . sodium chloride 75 mL/hr at 09/29/17 2135     LOS: 0 days        Coralie KeensMauricio Daniel Derald Lorge, MD [facility] Pager [phone]

## 2017-09-30 NOTE — Progress Notes (Signed)
Unable to complete patient's health history d/t being nonverbal and confused.  Attempted to call patient's wife to assist but unable to reach her. Will attempt to complete once family arrives and will pass along to next RN if unable to do so.

## 2017-10-01 ENCOUNTER — Encounter (HOSPITAL_COMMUNITY): Payer: Self-pay

## 2017-10-01 ENCOUNTER — Encounter: Payer: Self-pay | Admitting: Internal Medicine

## 2017-10-01 ENCOUNTER — Other Ambulatory Visit: Payer: Self-pay

## 2017-10-01 DIAGNOSIS — E875 Hyperkalemia: Secondary | ICD-10-CM

## 2017-10-01 DIAGNOSIS — R4702 Dysphasia: Secondary | ICD-10-CM | POA: Insufficient documentation

## 2017-10-01 DIAGNOSIS — N183 Chronic kidney disease, stage 3 (moderate): Secondary | ICD-10-CM

## 2017-10-01 DIAGNOSIS — E1122 Type 2 diabetes mellitus with diabetic chronic kidney disease: Secondary | ICD-10-CM

## 2017-10-01 DIAGNOSIS — N184 Chronic kidney disease, stage 4 (severe): Secondary | ICD-10-CM

## 2017-10-01 LAB — BASIC METABOLIC PANEL
ANION GAP: 7 (ref 5–15)
ANION GAP: 8 (ref 5–15)
BUN: 54 mg/dL — ABNORMAL HIGH (ref 6–20)
BUN: 50 mg/dL — ABNORMAL HIGH (ref 6–20)
CALCIUM: 8.6 mg/dL — AB (ref 8.9–10.3)
CHLORIDE: 109 mmol/L (ref 101–111)
CO2: 25 mmol/L (ref 22–32)
CO2: 26 mmol/L (ref 22–32)
Calcium: 8.9 mg/dL (ref 8.9–10.3)
Chloride: 109 mmol/L (ref 101–111)
Creatinine, Ser: 2.01 mg/dL — ABNORMAL HIGH (ref 0.61–1.24)
Creatinine, Ser: 1.92 mg/dL — ABNORMAL HIGH (ref 0.61–1.24)
GFR calc Af Amer: 35 mL/min — ABNORMAL LOW (ref >60)
GFR calc non Af Amer: 30 mL/min — ABNORMAL LOW (ref >60)
GFR calc non Af Amer: 32 mL/min — ABNORMAL LOW (ref >60)
GFR, EST AFRICAN AMERICAN: 37 mL/min — AB (ref >60)
GLUCOSE: 326 mg/dL — AB (ref 65–99)
GLUCOSE: 384 mg/dL — AB (ref 65–99)
POTASSIUM: 5.4 mmol/L — AB (ref 3.5–5.1)
Potassium: 5 mmol/L (ref 3.5–5.1)
Sodium: 141 mmol/L (ref 135–145)
Sodium: 143 mmol/L (ref 135–145)

## 2017-10-01 LAB — GLUCOSE, CAPILLARY
GLUCOSE-CAPILLARY: 278 mg/dL — AB (ref 65–99)
GLUCOSE-CAPILLARY: 319 mg/dL — AB (ref 65–99)
Glucose-Capillary: 345 mg/dL — ABNORMAL HIGH (ref 65–99)

## 2017-10-01 MED ORDER — FREE WATER
200.0000 mL | Freq: Three times a day (TID) | Status: DC
  Administered 2017-10-01: 200 mL

## 2017-10-01 MED ORDER — SODIUM POLYSTYRENE SULFONATE 15 GM/60ML PO SUSP
30.0000 g | ORAL | Status: AC
  Administered 2017-10-01 (×2): 30 g
  Filled 2017-10-01 (×2): qty 120

## 2017-10-01 MED ORDER — FREE WATER: 200.0000 mL | Freq: Three times a day (TID) | 0 refills | Status: AC

## 2017-10-01 MED ORDER — INSULIN GLARGINE 100 UNIT/ML ~~LOC~~ SOLN
10.0000 [IU] | Freq: Two times a day (BID) | SUBCUTANEOUS | Status: DC
  Administered 2017-10-01: 10 [IU] via SUBCUTANEOUS
  Filled 2017-10-01 (×2): qty 0.1

## 2017-10-01 NOTE — Progress Notes (Signed)
[facility] [phone]), IV and telemetry monitor removed, PTAR to transport to facility, personal belongings placed in bag to be transported with patient

## 2017-10-01 NOTE — Progress Notes (Signed)
Nutrition Brief Note  RD consulted for initiation of tube feeding.   Pt admitted for dislodged PEG tube. He had it replaced 6/23. He is currently tolerating his at home regimen of:   Glucerna 1.2 @ 70 ml/hr  Free water flushes 200 ml Q8 hours Provides: 2016 kcal, 101 grams protein, and 1361 ml free water (1961 ml with flushes).   Will continue with at home regimen at this time. Pt discharging today around 3 pm per RN. Discharge summary is in place.   Vanessa Kickarly Brieonna Crutcher RD, LDN Clinical Nutrition Pager # [phone]- [phone]

## 2017-10-01 NOTE — Progress Notes (Signed)
Pt returning to [facility] at DC today- report [phone].  Will arrange PTAR transportation once pt ready for DC (awaiting medication administration this afternoon per care team)  DC information provided via the HUB  Ilean SkillMeghan Sebastain Fishbaugh, MSW, LCSW Clinical Social Work 10/01/2017 [phone]

## 2017-10-01 NOTE — Discharge Summary (Addendum)
Physician Discharge Summary  Avelardo Reesman ZOX:[id number] DOB: Nov 18, 1940 DOA: 09/29/2017  PCP: Andreas Blower., MD  Admit date: 09/29/2017 Discharge date: 10/01/2017  Admitted From: SNF Disposition:  SNF  Recommendations for Outpatient Follow-up and new medication changes:  1. Follow up with Dr. Luiz Iron in one week.  2. PEG tube has been replaced, resume tube feedings 3. Follow up renal panel in 3 to 4 days.   Home Health: na   Equipment/Devices: na    Discharge Condition: stable  CODE STATUS: dnr   Diet recommendation: tube feedings.   Brief/Interim Summary: 77 year old male who presented with dislodged feeding tube.  He does have the past medical history for diastolic heart failure, chronic atrial fibrillation, chronic kidney disease stage 3, type 2 diabetes mellitus, encephalopathy with dysphagia sp PEG tube.  Apparently he is a nursing home resident, per nursing patient is not fully verbal.  On his initial physical examination his blood pressure was 94/79, heart rate 71, respiratory rate 17, temperature 97.8, oxygen saturation 100%,, moist mucous membranes, lungs clear to auscultation bilaterally, heart S1-S2 present irregularly irregular, abdomen soft nontender, no lower extremity edema.  Sodium 140, potassium 5.3, chloride 107, bicarb 27, glucose 159, BUN 53, creatinine 1.84, hemoglobin 8.1, hematocrit 26.1, platelets 160.  CT of the abdomen had no acute findings.  Gastrostomy tract visualized, no free intraperitoneal air, ascites or fluid collection.  Chest x-ray with left rotation, positive cardiomegaly, no infiltrates.  EKG sinus rhythm, left axis deviation, right bundle branch block.  Admitted with a working diagnosis of swallow dysfunction status post dislodged feeding tube.  1.  Swallow dysfunction status post PEG tube.  Patient was admitted to the medical ward, he was seen by interventional radiology and gastrostomy tube was replaced.  Tube feeds were resumed with no major  complications.  2.  Chronic kidney disease stage III with hyperkalemia.  Patient received intravenous fluids, his creatinine remained stable between 1.9 and 2.0, peak potassium 5.4, patient received Kayexalate before discharge.  Recommended recheck chemistry in 3 to 4 days.  3.  Chronic encephalopathy.  Patient has remained mainly nonverbal, no signs of acute distress.  4.  Paroxysmal atrial fibrillation.  Remain rate controlled, continue carvedilol per PEG tube.  No on anticoagulation due to risk of bleeding.  5.  Type 2 diabetes mellitus.  Patient was placed on insulin sliding scale for glucose coverage and monitoring, basal insulin with Levemir.  At discharge he will resume his insulin regimen.  6.  Pancreatic mass.  Incidental finding of small hypoattenuating mass measuring 1.6 cm from the pancreatic tail.  It is recommended follow-up contrast CT scan in 6 months.  7. HTN. Continue blood pressure control with carvedilol and hydralazine.   8.  Chronic respiratory failure, hypoxic/hypercapnic, obstructive sleep apnea.  Patient utilizes BiPAP at home.  Remain stable with no exacerbation during his hospitalization.  Discharge Diagnoses:  Principal Problem:   Malfunction of percutaneous endoscopic gastrostomy (PEG) tube (HCC) Active Problems:   Chronic kidney disease   Diabetes mellitus with stage 3 chronic kidney disease (HCC)   Obstructive sleep apnea   Paroxysmal atrial fibrillation (HCC)   Acute on chronic respiratory failure with hypoxia and hypercapnia (HCC)   Acute metabolic encephalopathy    Discharge Instructions   Allergies as of 10/01/2017      Reactions   Norvasc [amlodipine Besylate]    unknown   Penicillins    unknown   Pregabalin Other (See Comments)   Disoriented and weakness in the legs  Fentanyl Rash   Pt reports skin burn with generic fentanyl - he can use BRAND NAME duragesic without issue       Medication List    TAKE these medications    acetaminophen 160 MG/5ML suspension Commonly known as:  TYLENOL Place 650 mg into feeding tube every 6 (six) hours as needed for fever.   albuterol 108 (90 Base) MCG/ACT inhaler Commonly known as:  PROVENTIL HFA;VENTOLIN HFA Inhale 2 puffs into the lungs every 6 (six) hours as needed for wheezing.   aspirin 81 MG chewable tablet Place 81 mg into feeding tube daily.   atorvastatin 40 MG tablet Commonly known as:  LIPITOR Place 40 mg into feeding tube at bedtime.   brimonidine 0.2 % ophthalmic solution Commonly known as:  ALPHAGAN Place 1 drop into both eyes 3 (three) times daily.   carvedilol 6.25 MG tablet Commonly known as:  COREG Place 6.25 mg into feeding tube 2 (two) times daily with a meal.   Cholecalciferol 2000 units Chew Place 2,000 Units into feeding tube daily.   ciprofloxacin 0.3 % ophthalmic solution Commonly known as:  CILOXAN Place 2 drops into the right eye every 4 (four) hours while awake. Administer 1 drop, every 2 hours, while awake, for 2 days. Then 1 drop, every 4 hours, while awake, for the next 5 days.   docusate 50 MG/5ML liquid Commonly known as:  COLACE Place 100 mg into feeding tube 2 (two) times daily.   doxazosin 2 MG tablet Commonly known as:  CARDURA Place 2 mg into feeding tube daily.   erythromycin ophthalmic ointment Place 1 application into the right eye 4 (four) times daily.   feeding supplement (GLUCERNA 1.2 CAL) Liqd Place 1,000 mLs into feeding tube continuous. Continuous feeding via PEG at 64mLs/hour until Glucerna 1.5 is available   folic acid 1 MG tablet Commonly known as:  FOLVITE Place 1 mg into feeding tube daily.   free water Soln Place 200 mLs into feeding tube every 8 (eight) hours.   HUMALOG KWIKPEN Wilder Inject 1-15 Units into the skin every 6 (six) hours.   hydrALAZINE 50 MG tablet Commonly known as:  APRESOLINE Place 50 mg into feeding tube every 8 (eight) hours.   LANTUS 100 UNIT/ML injection Generic drug:   insulin glargine Inject 24 Units into the skin 2 (two) times daily.   lidocaine 5 % Commonly known as:  LIDODERM Place 1-3 patches onto the skin daily as needed (pain). Remove & Discard patch within 12 hours or as directed by MD   nitroGLYCERIN 0.4 MG SL tablet Commonly known as:  NITROSTAT Place 0.4 mg under the tongue. Place one SL tablet under the tongue every 5 minutes as needed for chest pain for 3 doses   pantoprazole sodium 40 mg/20 mL Pack Commonly known as:  PROTONIX Place 40 mg into feeding tube daily.   polyvinyl alcohol 1.4 % ophthalmic solution Commonly known as:  LIQUIFILM TEARS Place 1 drop into both eyes as needed for dry eyes.   thiamine 100 MG tablet Place 100 mg into feeding tube daily.       Allergies  Allergen Reactions  . Norvasc [Amlodipine Besylate]     unknown  . Penicillins     unknown  . Pregabalin Other (See Comments)    Disoriented and weakness in the legs  . Fentanyl Rash    Pt reports skin burn with generic fentanyl - he can use BRAND NAME duragesic without issue     Consultations:  IR   Procedures/Studies: Ct Abdomen Pelvis Wo Contrast  Result Date: 09/29/2017 CLINICAL DATA:  reportedly the G-Tube came out as staff were doing peri care on pt. He is agitated something EMS states that's baseline secondary to hx of dementia. EXAM: CT ABDOMEN AND PELVIS WITHOUT CONTRAST TECHNIQUE: Multidetector CT imaging of the abdomen and pelvis was performed following the standard protocol without IV contrast. COMPARISON:  None. FINDINGS: Lower chest: Heart is mildly enlarged. Mild lung base scarring and/or subsegmental atelectasis. No acute findings. Is Hepatobiliary: Liver normal in overall size. No liver mass or focal lesion. There are morphologic changes with central volume loss and relative enlargement of the lateral segment left lobe that raises possibility of cirrhosis. Gallbladder surgically absent. No bile duct dilation. Pancreas: 16 mm  hypoattenuating mass arises from pancreatic tail. This is not clearly cystic and incompletely characterized on this unenhanced study. No other pancreatic masses. No pancreatic inflammation. Spleen: Spleen borderline enlarged measuring 12 cm in long axis. No splenic masses or focal lesions. Adrenals/Urinary Tract: No adrenal masses. Right kidney is inferiorly located. It demonstrates a lobulated contour. There is no defined right renal mass. No collecting system stone. No hydronephrosis. Left kidney is normal in size and position. No left renal mass, stone or hydronephrosis. Ureters are normal in course and in caliber. Bladder is distended.  No bladder wall thickening, masses or stones. Stomach/Bowel: Anterior aspect of the stomach is retracted against the anterior peritoneal cavity adjacent to a gastrostomy tract. There is no extraluminal air or fluid. Stomach otherwise unremarkable. Bowel is normal in caliber. No bowel wall thickening or inflammatory changes. Vascular/Lymphatic: There several prominent gastrohepatic ligament lymph nodes, none pathologically enlarged by size criteria. No enlarged lymph nodes. Dense atherosclerotic calcifications are noted along the abdominal aorta and its branch vessels. No aneurysm. Reproductive: Unremarkable. Other: No fluid collection is seen along the gastrostomy tube tract. No abdominal wall hernia. No ascites. No evidence of an abdominal abscess. Musculoskeletal: L5 is a transitional lumbosacral vertebra. 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 No acute fracture. No osteoblastic or osteolytic lesions. IMPRESSION: 1. No acute findings within the abdomen or pelvis. 2. The gastrostomy tract is visualized. There is no free intraperitoneal air, ascites or fluid collection to suggest a complication related to the gastrostomy tube. 3. 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 Borderline splenomegaly. 4. Small  hypoattenuating mass measuring 1.6 cm arises from the pancreatic tail. This could reflect a neoplasm. It could be cystic but is not well-defined on this unenhanced study. Recommend follow-up CT in 6 months, with contrast if the patient can tolerate contrast. 5. Dense aortic atherosclerosis. 6. Ptotic right kidney. No hydronephrosis or acute renal abnormality. 7. Distended bladder. 8. 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 Electronically Signed   By: Amie Portland M.D.   On: 09/29/2017 18:39   Ir Replc Gastro/colonic Tube Percut W/fluoro  Result Date: 09/30/2017 INDICATION: History of end-stage dementia with recently placed gastrostomy tube, inadvertently removed. Request made for fluoroscopic guided gastrostomy tube placement. Gastrostomy tube placed at an outside institution. EXAM: FLUOROSCOPIC GUIDED REPLACEMENT OF GASTROSTOMY TUBE COMPARISON:  CT abdomen and pelvis - 09/29/2017 MEDICATIONS: None. CONTRAST:  15 cc Isovue-300 - administered into the gastric lumen FLUOROSCOPY TIME:  2 minutes 6 seconds (50 mGy) COMPLICATIONS: None immediate. PROCEDURE: The site of the prior gastrostomy tube was cannulated with a Christmas tree adapter with contrast injection demonstrating atretic though  patent gastrostomy track to the level of the stomach. A Kumpe catheter was advanced through the gastrostomy track to the level of the stomach and distal esophagus. Contrast injection confirmed appropriate positioning. Next, over a short Amplatz wire, a new, 18 French balloon inflatable gastrostomy tube was inserted through the track. The balloon was inflated and disc was cinched. Contrast was injected and several spot fluoroscopic images were obtained in various obliquities to confirm appropriate intraluminal positioning. A dressing was placed. The patient tolerated the procedure well without immediate postprocedural complication. IMPRESSION: Successful fluoroscopic guided replacement  of a new 18-French gastrostomy tube. The new gastrostomy tube is ready for immediate use. Electronically Signed   By: Simonne Come M.D.   On: 09/30/2017 11:30   Dg Chest Port 1 View  Result Date: 09/29/2017 CLINICAL DATA:  Dysphagia. EXAM: PORTABLE CHEST 1 VIEW COMPARISON:  None. FINDINGS: The patient is rotated. Heart size is likely accentuated by technique and rotation, as it is normal on CT from same day. Atherosclerotic calcification of the aortic arch. Normal pulmonary vascularity. No focal consolidation, pleural effusion, or pneumothorax. No acute osseous abnormality. IMPRESSION: 1. No active disease. 2.  Aortic atherosclerosis (ICD10-I70.0). Electronically Signed   By: Obie Dredge M.D.   On: 09/29/2017 21:01       Subjective: Patient has been tolerating tube feedings well, no nausea or vomiting, no chest pain or dyspnea.   Discharge Exam: Vitals:   09/30/17 2130 10/01/17 0529  BP: (!) 165/80 (!) 186/85  Pulse: 80 86  Resp: (!) 22 20  Temp: 98.5 F (36.9 C) 98 F (36.7 C)  SpO2: 98% 99%   Vitals:   09/30/17 1551 09/30/17 1808 09/30/17 2130 10/01/17 0529  BP: (!) 169/70 (!) 135/91 (!) 165/80 (!) 186/85  Pulse: 77 85 80 86  Resp:  18 (!) 22 20  Temp:  98.4 F (36.9 C) 98.5 F (36.9 C) 98 F (36.7 C)  TempSrc:  Axillary Oral Oral  SpO2:  97% 98% 99%  Weight:      Height:        General: Not in pain or dyspnea, deconditioned  Neurology: Awake and alert, non focal  E ENT: Mild pallor, no icterus, oral mucosa moist Cardiovascular: No JVD. S1-S2 present, rhythmic, no gallops, rubs, or murmurs. Trace lower extremity edema. Pulmonary: vesicular breath sounds bilaterally, adequate air movement, no wheezing, rhonchi or rales. Gastrointestinal. Abdomen with, no organomegaly, non tender, no rebound or guarding/ peg tube in place.  Skin. No rashes Musculoskeletal: no joint deformities   The results of significant diagnostics from this hospitalization (including imaging,  microbiology, ancillary and laboratory) are listed below for reference.     Microbiology: Recent Results (from the past 240 hour(s))  MRSA PCR Screening     Status: Abnormal   Collection Time: 09/30/17  6:30 AM  Result Value Ref Range Status   MRSA by PCR POSITIVE (A) NEGATIVE Final    Comment:        The GeneXpert MRSA Assay (FDA approved for NASAL specimens only), is one component of a comprehensive MRSA colonization surveillance program. It is not intended to diagnose MRSA infection nor to guide or monitor treatment for MRSA infections. RESULT CALLED TO, READ BACK BY AND VERIFIED WITH: H.HOLDERNESS RN 09/30/2017 1246 JR Performed at [facility], [address], [place], [place] [address]      Labs: BNP (last 3 results) Recent Labs    11/27/16 2035  BNP 207.7*   Basic Metabolic Panel: Recent  Labs  Lab 09/26/17 09/29/17 1839 09/30/17 0403 10/01/17 0434  NA 135 140 143 141  K 5.2 5.3* 5.3* 5.4*  CL  --  107 108 109  CO2  --  27 26 25   GLUCOSE  --  159* 194* 326*  BUN 60* 53* 52* 54*  CREATININE 2.17 1.84* 1.92* 2.01*  CALCIUM 8.8 8.9 8.9 8.9   Liver Function Tests: Recent Labs  Lab 09/29/17 1839  AST 54*  ALT 48  ALKPHOS 198*  BILITOT 0.5  PROT 6.4*  ALBUMIN 2.7*   No results for input(s): LIPASE, AMYLASE in the last 168 hours. Recent Labs  Lab 09/30/17 0003  AMMONIA 23   CBC: Recent Labs  Lab 09/26/17 09/29/17 1839 09/30/17 0403  WBC 5.6 6.4 7.8  NEUTROABS  --  4.9  --   HGB 8.8 8.1* 9.5*  HCT 26.9 26.1* 30.3*  MCV 85.9 89.4 88.3  PLT  --  160 151   Cardiac Enzymes: No results for input(s): CKTOTAL, CKMB, CKMBINDEX, TROPONINI in the last 168 hours. BNP: Invalid input(s): POCBNP CBG: Recent Labs  Lab 09/30/17 0622 09/30/17 1138 09/30/17 1806 09/30/17 2358 10/01/17 0526  GLUCAP 192* 226* 254* 278* 319*   D-Dimer No results for input(s): DDIMER in the last 72 hours. Hgb A1c No results for input(s): HGBA1C  in the last 72 hours. Lipid Profile No results for input(s): CHOL, HDL, LDLCALC, TRIG, CHOLHDL, LDLDIRECT in the last 72 hours. Thyroid function studies No results for input(s): TSH, T4TOTAL, T3FREE, THYROIDAB in the last 72 hours.  Invalid input(s): FREET3 Anemia work up No results for input(s): VITAMINB12, FOLATE, FERRITIN, TIBC, IRON, RETICCTPCT in the last 72 hours. Urinalysis    Component Value Date/Time   COLORURINE AMBER (A) 11/27/2016 2230   APPEARANCEUR CLEAR 11/27/2016 2230   LABSPEC 1.016 11/27/2016 2230   PHURINE 5.0 11/27/2016 2230   GLUCOSEU >=500 (A) 11/27/2016 2230   HGBUR NEGATIVE 11/27/2016 2230   BILIRUBINUR SMALL (A) 11/27/2016 2230   KETONESUR NEGATIVE 11/27/2016 2230   PROTEINUR NEGATIVE 11/27/2016 2230   NITRITE NEGATIVE 11/27/2016 2230   LEUKOCYTESUR NEGATIVE 11/27/2016 2230   Sepsis Labs Invalid input(s): PROCALCITONIN,  WBC,  LACTICIDVEN Microbiology Recent Results (from the past 240 hour(s))  MRSA PCR Screening     Status: Abnormal   Collection Time: 09/30/17  6:30 AM  Result Value Ref Range Status   MRSA by PCR POSITIVE (A) NEGATIVE Final    Comment:        The GeneXpert MRSA Assay (FDA approved for NASAL specimens only), is one component of a comprehensive MRSA colonization surveillance program. It is not intended to diagnose MRSA infection nor to guide or monitor treatment for MRSA infections. RESULT CALLED TO, READ BACK BY AND VERIFIED WITH: H.HOLDERNESS RN 09/30/2017 1246 JR Performed at [facility], [address] [place], [place] [address]      Time coordinating discharge: 45 minutes  SIGNED:   Coralie KeensMauricio Daniel Arrien, MD  [facility] 10/01/2017, 11:09 AM Pager [phone]  If 7PM-7AM, please contact night-coverage [email or web address] Password [email or web address]

## 2017-10-02 DIAGNOSIS — S0501XA Injury of conjunctiva and corneal abrasion without foreign body, right eye, initial encounter: Secondary | ICD-10-CM | POA: Insufficient documentation

## 2017-10-02 DIAGNOSIS — K219 Gastro-esophageal reflux disease without esophagitis: Secondary | ICD-10-CM | POA: Insufficient documentation

## 2017-10-02 DIAGNOSIS — I5032 Chronic diastolic (congestive) heart failure: Secondary | ICD-10-CM | POA: Insufficient documentation

## 2017-10-03 ENCOUNTER — Non-Acute Institutional Stay (SKILLED_NURSING_FACILITY): Payer: Medicare Other | Admitting: Internal Medicine

## 2017-10-03 ENCOUNTER — Encounter: Payer: Self-pay | Admitting: Internal Medicine

## 2017-10-03 DIAGNOSIS — K869 Disease of pancreas, unspecified: Secondary | ICD-10-CM | POA: Diagnosis not present

## 2017-10-03 DIAGNOSIS — N183 Chronic kidney disease, stage 3 unspecified: Secondary | ICD-10-CM

## 2017-10-03 DIAGNOSIS — I129 Hypertensive chronic kidney disease with stage 1 through stage 4 chronic kidney disease, or unspecified chronic kidney disease: Secondary | ICD-10-CM

## 2017-10-03 DIAGNOSIS — K8689 Other specified diseases of pancreas: Secondary | ICD-10-CM

## 2017-10-03 DIAGNOSIS — R4702 Dysphasia: Secondary | ICD-10-CM | POA: Diagnosis not present

## 2017-10-03 DIAGNOSIS — E785 Hyperlipidemia, unspecified: Secondary | ICD-10-CM

## 2017-10-03 DIAGNOSIS — K9423 Gastrostomy malfunction: Secondary | ICD-10-CM | POA: Diagnosis not present

## 2017-10-03 DIAGNOSIS — G9349 Other encephalopathy: Secondary | ICD-10-CM

## 2017-10-03 DIAGNOSIS — N184 Chronic kidney disease, stage 4 (severe): Secondary | ICD-10-CM | POA: Diagnosis not present

## 2017-10-03 DIAGNOSIS — E1169 Type 2 diabetes mellitus with other specified complication: Secondary | ICD-10-CM | POA: Diagnosis not present

## 2017-10-03 DIAGNOSIS — E875 Hyperkalemia: Secondary | ICD-10-CM | POA: Diagnosis not present

## 2017-10-03 LAB — BASIC METABOLIC PANEL
BUN: 55 — AB (ref 4–21)
Creatinine: 2.1 — AB (ref 0.6–1.3)
Glucose: 290
POTASSIUM: 4.6 (ref 3.4–5.3)
Sodium: 147 (ref 137–147)

## 2017-10-03 LAB — CBC AND DIFFERENTIAL
HCT: 28 — AB (ref 41–53)
HEMOGLOBIN: 9 — AB (ref 13.5–17.5)
PLATELETS: 132 — AB (ref 150–399)
WBC: 6.4

## 2017-10-03 NOTE — Progress Notes (Signed)
:    Location:  [facility] Room Number: 321-P Place of Service:  SNF (31)   Ramon Zanders D. Lyn Hollingshead, MD  PCP: Andreas Blower., MD Patient Care Team: Andreas Blower., MD as PCP - General (Internal Medicine)  Extended Emergency Contact Information Primary Emergency Contact: Marcella,Jean Address: [address]           [place], [place] [address] [place] Home Phone: [phone] Relation: Spouse     Allergies: Norvasc [amlodipine besylate]; Penicillins; Pregabalin; and Fentanyl  Chief Complaint  Patient presents with  . New Admit To SNF    Admit to [facility]    HPI: Patient is 77 y.o. male diastolic congestive heart failure, atrial fibrillation, chronic respiratory failure, dysphasia status post PEG, CKD stage III/IV, diabetes mellitus type 2, OSA on BiPAP, who presented to [facility] emergency department after accidental dislodgment of his PEG tube.  Patient unable to provide history as he is nonverbal with altered mental status records show the PEG tube was placed to [facility] on 6/11 due to recurrent aspiration pneumonia and dysphasia with failed speech evaluation.  On admission labs showed a WBC of 6.4 hemoglobin of 8.1 and a potassium of 5.3 BUN 53, creatinine 1.84, CT abdomen showed chronic changes suggestive of liver cirrhosis and a 1.6 cm mass in the pancreatic tail that could represent neoplasm.  Patient was admitted to [facility] from 6/20 2-24.  Initial radiology replace his gastric tube and tube feedings were resumed.  Patient's hyperkalemia was treated with Kayexalate. Patient is stable and discharged to skilled nursing facility for OT/PT and supportive care.  While at skilled nursing facility patient will be followed for hyperlipidemia treated with Lipitor hypertension treated with Coreg Cardura and hydralazine and diabetes mellitus with insulins.  Past Medical History:  Diagnosis Date  . Abnormal LFTs  11/28/2016  . Acquired spondylolisthesis 09/14/2012  . Adjustment disorder with depressed mood 03/03/2013  . AKI (acute kidney injury) (HCC) 02/21/2016  . 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 Back pain   . Benign hypertension with chronic kidney disease, stage III (HCC) 06/14/2015  . CAD (coronary artery disease) 02/22/2015  . Chronic kidney disease 02/22/2015  . Depressive disorder 09/14/2012  . Diabetes mellitus with stage 3 chronic kidney disease (HCC) 06/14/2015  . Diabetes mellitus without complication (HCC)   . Diabetic macular edema (HCC) 09/12/2012  . 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 Displacement of lumbar intervertebral disc 09/14/2012  . GERD (gastroesophageal reflux disease)   . High cholesterol   . Hypertension   . Morbid obesity (HCC) 09/14/2012  . Obesity   . Obstructive sleep apnea 02/22/2015  . Old myocardial infarction 02/22/2015  . Paroxysmal atrial fibrillation (HCC) 11/28/2016  . Pseudogout 12/25/2013   Overview:  Based on Right olecranon bursa synovial fluid analysis  . Renal disorder   . Restrictive lung disease 06/02/2016  . Right posterior capsular opacification 02/04/2015  . RLS (restless legs syndrome) 08/03/2016  . Status post intraocular lens implant 05/15/2013  . Tear of medial cartilage or meniscus of knee, current 09/14/2012  . Uncontrolled type 2 diabetes mellitus with diabetic polyneuropathy, with long-term current use of insulin (HCC) 08/12/2015  . Vitamin D deficiency 06/14/2015    Past Surgical History:  Procedure Laterality Date  . CARDIAC SURGERY  2000   stents placed  in heart  . IR REPLC GASTRO/COLONIC TUBE PERCUT W/FLUORO  09/30/2017  . KNEE SURGERY Left 09/14/2012  . LUMBAR DISC SURGERY      Allergies as of 10/03/2017      Reactions   Norvasc [amlodipine Besylate]    unknown   Penicillins    unknown   Pregabalin Other (See  Comments)   Disoriented and weakness in the legs   Fentanyl Rash   Pt reports skin burn with generic fentanyl - he can use BRAND NAME duragesic without issue       Medication List        Accurate as of 10/03/17 11:20 AM. Always use your most recent med list.          acetaminophen 160 MG/5ML suspension Commonly known as:  TYLENOL Place 650 mg into feeding tube every 6 (six) hours as needed for fever.   albuterol 108 (90 Base) MCG/ACT inhaler Commonly known as:  PROVENTIL HFA;VENTOLIN HFA Inhale 2 puffs into the lungs every 6 (six) hours as needed for wheezing.   aspirin 81 MG chewable tablet Place 81 mg into feeding tube daily.   atorvastatin 40 MG tablet Commonly known as:  LIPITOR Place 40 mg into feeding tube at bedtime.   brimonidine 0.2 % ophthalmic solution Commonly known as:  ALPHAGAN Place 1 drop into both eyes 3 (three) times daily.   carvedilol 6.25 MG tablet Commonly known as:  COREG Place 6.25 mg into feeding tube 2 (two) times daily with a meal.   Cholecalciferol 2000 units Chew Place 2,000 Units into feeding tube daily.   ciprofloxacin 0.3 % ophthalmic solution Commonly known as:  CILOXAN Place 2 drops into the right eye every 4 (four) hours while awake. Administer 1 drop, every 2 hours, while awake, for 2 days. Then 1 drop, every 4 hours, while awake, for the next 5 days.   docusate 50 MG/5ML liquid Commonly known as:  COLACE Place 100 mg into feeding tube 2 (two) times daily.   doxazosin 2 MG tablet Commonly known as:  CARDURA Place 2 mg into feeding tube daily.   erythromycin ophthalmic ointment Place 1 application into the right eye 4 (four) times daily.   feeding supplement (GLUCERNA 1.2 CAL) Liqd Place 1,000 mLs into feeding tube continuous. Continuous feeding via PEG at 69mLs/hour until Glucerna 1.5 is available   folic acid 1 MG tablet Commonly known as:  FOLVITE Place 1 mg into feeding tube daily.   free water Soln Place 200 mLs  into feeding tube every 8 (eight) hours.   HUMALOG KWIKPEN [place] Inject 1-15 Units into the skin every 6 (six) hours.   hydrALAZINE 50 MG tablet Commonly known as:  APRESOLINE Place 50 mg into feeding tube every 8 (eight) hours.   LANTUS 100 UNIT/ML injection Generic drug:  insulin glargine Inject 24 Units into the skin 2 (two) times daily.   lidocaine 5 % Commonly known as:  LIDODERM Place 1-3 patches onto the skin daily as needed (pain). Remove & Discard patch within 12 hours or as directed by MD   nitroGLYCERIN 0.4 MG SL tablet Commonly known as:  NITROSTAT Place 0.4 mg under the tongue. Place one SL tablet under the tongue every 5 minutes as needed for chest pain for 3 doses   pantoprazole sodium 40 mg/20 mL Pack Commonly known as:  PROTONIX Place 40 mg into feeding tube daily.   polyvinyl alcohol 1.4 % ophthalmic solution Commonly known as:  LIQUIFILM TEARS Place 1 drop into  both eyes as needed for dry eyes.   thiamine 100 MG tablet Place 100 mg into feeding tube daily.       No orders of the defined types were placed in this encounter.   Immunization History  Administered Date(s) Administered  . Influenza, High Dose Seasonal PF 01/05/2016  . Pneumococcal Conjugate-13 06/06/2013  . Pneumococcal Polysaccharide-23 02/13/2007  . Tdap 03/07/2010  . Zoster 09/17/2007    Social History   Tobacco Use  . Smoking status: Former Smoker    Packs/day: 1.00    Years: 20.00    Pack years: 20.00    Types: Cigarettes    Last attempt to quit: 09/28/2016    Years since quitting: 1.0  . Smokeless tobacco: Never Used  Substance Use Topics  . Alcohol use: No    Family history is   Family History  Problem Relation Age of Onset  . Diabetes Mother   . Hypertension Mother   . Hypertension Father   . Stroke Father   . Cancer Sister        bowel  . Diabetes Maternal Grandmother       Review of Systems to obtain secondary to nonverbal status; nursing-no acute  concerns    Vitals:   10/03/17 1104  BP: (!) 166/78  Pulse: 89  Resp: 20  Temp: 97.8 F (36.6 C)    SpO2 Readings from Last 1 Encounters:  10/01/17 99%   Body mass index is 33.36 kg/m.     Physical Exam  GENERAL APPEARANCE: Alert,  No acute distress.  SKIN: No diaphoresis rash HEAD: Normocephalic, atraumatic  EYES: Conjunctiva/lids clear. Pupils round, reactive. EOMs intact.  EARS: External exam WNL, canals clear. Hearing grossly normal.  NOSE: No deformity or discharge.  MOUTH/THROAT: Lips w/o lesions  RESPIRATORY: Breathing is even, unlabored. Lung sounds are clear   CARDIOVASCULAR: Heart RRR no murmurs, rubs or gallops. No peripheral edema.   GASTROINTESTINAL: Abdomen is soft, non-tender, not distended w/ normal bowel sounds; PEG tube in place GENITOURINARY: Bladder non tender, not distended  MUSCULOSKELETAL: No abnormal joints or musculature NEUROLOGIC:  Cranial nerves 2-12 grossly intact. Moves all extremities  PSYCHIATRIC: Mood and affect appropriate to situation, no behavioral issues  Patient Active Problem List   Diagnosis Date Noted  . Chronic diastolic (congestive) heart failure (HCC) 10/02/2017  . Corneal abrasion, right 10/02/2017  . GERD (gastroesophageal reflux disease) 10/02/2017  . Dysphasia 10/01/2017  . Malfunction of percutaneous endoscopic gastrostomy (PEG) tube (HCC) 09/29/2017  . Acute on chronic respiratory failure with hypoxia and hypercapnia (HCC) 09/29/2017  . Acute metabolic encephalopathy 09/29/2017  . Acute cholecystitis due to biliary calculus 12/17/2016  . Acute urinary retention 12/17/2016  . Acute renal failure superimposed on stage 3 chronic kidney disease (HCC) 12/17/2016  . Hyperchloremic acidosis 12/17/2016  . Hypomagnesemia 12/17/2016  . Pancytopenia (HCC) 12/17/2016  . Hyperlipidemia associated with type 2 diabetes mellitus (HCC) 12/17/2016  . Abnormal LFTs 11/28/2016  . Paroxysmal atrial fibrillation (HCC) 11/28/2016  .  RLS (restless legs syndrome) 08/03/2016  . Restrictive lung disease 06/02/2016  . AKI (acute kidney injury) (HCC) 02/21/2016  . Uncontrolled type 2 diabetes mellitus with diabetic polyneuropathy, with long-term current use of insulin (HCC) 08/12/2015  . 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 Benign hypertension with chronic kidney disease, stage III (HCC) 06/14/2015  . Diabetes mellitus with stage 3 chronic kidney disease (HCC) 06/14/2015  . Vitamin D deficiency 06/14/2015  . CAD (coronary artery disease) 02/22/2015  .  Chronic kidney disease 02/22/2015  . Obstructive sleep apnea 02/22/2015  . Old myocardial infarction 02/22/2015  . Right posterior capsular opacification 02/04/2015  . Pseudogout 12/25/2013  . Status post intraocular lens implant 05/15/2013  . Adjustment disorder with depressed mood 03/03/2013  . Acquired spondylolisthesis 09/14/2012  . Depressive disorder 09/14/2012  . Displacement of lumbar intervertebral disc 09/14/2012  . Morbid obesity (HCC) 09/14/2012  . Tear of medial cartilage or meniscus of knee, current 09/14/2012  . Diabetic macular edema (HCC) 09/12/2012  . 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 associated with type 2 diabetes mellitus (HCC) 08/01/2012      Labs reviewed: Basic Metabolic Panel:    Component Value Date/Time   NA 143 10/01/2017 1439   NA 135 09/26/2017   K 5.0 10/01/2017 1439   K 5.2 09/26/2017   CL 109 10/01/2017 1439   CO2 26 10/01/2017 1439   GLUCOSE 384 (H) 10/01/2017 1439   BUN 50 (H) 10/01/2017 1439   BUN 60 (A) 09/26/2017   CREATININE 1.92 (H) 10/01/2017 1439   CREATININE 2.17 09/26/2017   CALCIUM 8.6 (L) 10/01/2017 1439   CALCIUM 8.8 09/26/2017   PROT 6.4 (L) 09/29/2017 1839   ALBUMIN 2.7 (L) 09/29/2017 1839   AST 54 (H) 09/29/2017 1839   ALT 48 09/29/2017 1839   ALKPHOS 198 (H) 09/29/2017 1839   BILITOT 0.5 09/29/2017 1839   GFRNONAA 32 (L) 10/01/2017 1439    GFRNONAA 27.98 09/26/2017   GFRAA 37 (L) 10/01/2017 1439    Recent Labs    09/30/17 0403 10/01/17 0434 10/01/17 1439  NA 143 141 143  K 5.3* 5.4* 5.0  CL 108 109 109  CO2 26 25 26   GLUCOSE 194* 326* 384*  BUN 52* 54* 50*  CREATININE 1.92* 2.01* 1.92*  CALCIUM 8.9 8.9 8.6*   Liver Function Tests: Recent Labs    11/27/16 2035 09/08/17 09/29/17 1839  AST 108* 36 54*  ALT 113* 21 48  ALKPHOS 254* 170* 198*  BILITOT 4.5*  --  0.5  PROT 6.5  --  6.4*  ALBUMIN 3.4*  --  2.7*   Recent Labs    11/27/16 2035  LIPASE 26   Recent Labs    09/30/17 0003  AMMONIA 23   CBC: Recent Labs    11/27/16 2035  09/20/17 09/26/17 09/29/17 1839 09/30/17 0403  WBC 3.7*   < > 9.1 5.6 6.4 7.8  NEUTROABS 2.8  --   --   --  4.9  --   HGB 9.7*   < > 10.1* 8.8 8.1* 9.5*  HCT 29.1*   < > 31* 26.9 26.1* 30.3*  MCV 87.1  --   --  85.9 89.4 88.3  PLT 95*   < > 201  --  160 151   < > = values in this interval not displayed.   Lipid No results for input(s): CHOL, HDL, LDLCALC, TRIG in the last 8760 hours.  Cardiac Enzymes: Recent Labs    11/27/16 2035  TROPONINI 0.06*   BNP: Recent Labs    11/27/16 2035  BNP 207.7*   No results found for: MICROALBUR No results found for: HGBA1C No results found for: TSH No results found for: VITAMINB12 No results found for: FOLATE No results found for: IRON, TIBC, FERRITIN  Imaging and Procedures obtained prior to SNF admission: Ct Abdomen Pelvis Wo Contrast  Result Date: 09/29/2017 CLINICAL DATA:  reportedly the G-Tube came out as staff were doing peri care on  pt. He is agitated something EMS states that's baseline secondary to hx of dementia. EXAM: CT ABDOMEN AND PELVIS WITHOUT CONTRAST TECHNIQUE: Multidetector CT imaging of the abdomen and pelvis was performed following the standard protocol without IV contrast. COMPARISON:  None. FINDINGS: Lower chest: Heart is mildly enlarged. Mild lung base scarring and/or subsegmental atelectasis. No  acute findings. Is Hepatobiliary: Liver normal in overall size. No liver mass or focal lesion. There are morphologic changes with central volume loss and relative enlargement of the lateral segment left lobe that raises possibility of cirrhosis. Gallbladder surgically absent. No bile duct dilation. Pancreas: 16 mm hypoattenuating mass arises from pancreatic tail. This is not clearly cystic and incompletely characterized on this unenhanced study. No other pancreatic masses. No pancreatic inflammation. Spleen: Spleen borderline enlarged measuring 12 cm in long axis. No splenic masses or focal lesions. Adrenals/Urinary Tract: No adrenal masses. Right kidney is inferiorly located. It demonstrates a lobulated contour. There is no defined right renal mass. No collecting system stone. No hydronephrosis. Left kidney is normal in size and position. No left renal mass, stone or hydronephrosis. Ureters are normal in course and in caliber. Bladder is distended.  No bladder wall thickening, masses or stones. Stomach/Bowel: Anterior aspect of the stomach is retracted against the anterior peritoneal cavity adjacent to a gastrostomy tract. There is no extraluminal air or fluid. Stomach otherwise unremarkable. Bowel is normal in caliber. No bowel wall thickening or inflammatory changes. Vascular/Lymphatic: There several prominent gastrohepatic ligament lymph nodes, none pathologically enlarged by size criteria. No enlarged lymph nodes. Dense atherosclerotic calcifications are noted along the abdominal aorta and its branch vessels. No aneurysm. Reproductive: Unremarkable. Other: No fluid collection is seen along the gastrostomy tube tract. No abdominal wall hernia. No ascites. No evidence of an abdominal abscess. Musculoskeletal: L5 is a transitional lumbosacral vertebra. 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 No acute fracture. No osteoblastic or osteolytic lesions. IMPRESSION:  1. No acute findings within the abdomen or pelvis. 2. The gastrostomy tract is visualized. There is no free intraperitoneal air, ascites or fluid collection to suggest a complication related to the gastrostomy tube. 3. 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 Borderline splenomegaly. 4. Small hypoattenuating mass measuring 1.6 cm arises from the pancreatic tail. This could reflect a neoplasm. It could be cystic but is not well-defined on this unenhanced study. Recommend follow-up CT in 6 months, with contrast if the patient can tolerate contrast. 5. Dense aortic atherosclerosis. 6. Ptotic right kidney. No hydronephrosis or acute renal abnormality. 7. Distended bladder. 8. 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 Electronically Signed   By: Amie Portlandavid  Ormond M.D.   On: 09/29/2017 18:39   Ir Replc Gastro/colonic Tube Percut W/fluoro  Result Date: 09/30/2017 INDICATION: History of end-stage dementia with recently placed gastrostomy tube, inadvertently removed. Request made for fluoroscopic guided gastrostomy tube placement. Gastrostomy tube placed at an outside institution. EXAM: FLUOROSCOPIC GUIDED REPLACEMENT OF GASTROSTOMY TUBE COMPARISON:  CT abdomen and pelvis - 09/29/2017 MEDICATIONS: None. CONTRAST:  15 cc Isovue-300 - administered into the gastric lumen FLUOROSCOPY TIME:  2 minutes 6 seconds (50 mGy) COMPLICATIONS: None immediate. PROCEDURE: The site of the prior gastrostomy tube was cannulated with a Christmas tree adapter with contrast injection demonstrating atretic though patent gastrostomy track to the level of the stomach. A Kumpe catheter was advanced through the gastrostomy track to the level of the stomach and distal esophagus. Contrast  injection confirmed appropriate positioning. Next, over a short Amplatz wire, a new, 18 French balloon inflatable gastrostomy tube was inserted through the track. The balloon was inflated and disc was cinched.  Contrast was injected and several spot fluoroscopic images were obtained in various obliquities to confirm appropriate intraluminal positioning. A dressing was placed. The patient tolerated the procedure well without immediate postprocedural complication. IMPRESSION: Successful fluoroscopic guided replacement of a new 18-French gastrostomy tube. The new gastrostomy tube is ready for immediate use. Electronically Signed   By: Simonne Come M.D.   On: 09/30/2017 11:30   Dg Chest Port 1 View  Result Date: 09/29/2017 CLINICAL DATA:  Dysphagia. EXAM: PORTABLE CHEST 1 VIEW COMPARISON:  None. FINDINGS: The patient is rotated. Heart size is likely accentuated by technique and rotation, as it is normal on CT from same day. Atherosclerotic calcification of the aortic arch. Normal pulmonary vascularity. No focal consolidation, pleural effusion, or pneumothorax. No acute osseous abnormality. IMPRESSION: 1. No active disease. 2.  Aortic atherosclerosis (ICD10-I70.0). Electronically Signed   By: Obie Dredge M.D.   On: 09/29/2017 21:01     Not all labs, radiology exams or other studies done during hospitalization come through on my EPIC note; however they are reviewed by me.    Assessment and Plan  Dysphasia status post PEG tube- PEG tube was replaced by interventional radiology and feedings were resumed with no major problems SNF-patient is admitted to skilled nursing facility for OT/PT and supportive care  Chronic kidney disease stage III-4/hyperkalemia-patient received IV fluids, creatinine remained stable between 1.9 and 2; patient's potassium of 5.4 was treated with Kayexalate SNF -follow-up BMP  Chronic encephalopathy-patient has remained mainly nonverbal, no signs of distress  Hyperlipidemia SNF-not stated as uncontrolled; continue Lipitor 40 mg daily  Diabetes mellitus type 2 SNF -continue Lantus 24 units twice daily and sliding scale insulin twice daily  Hypertension SNF -not well controlled  controlled; continue Coreg 6.25 mg twice daily, Cardura 2 mg daily and increasing hydralazine from 50 mg to 100 mg per tube every 8 hours  Pancreatic mass-incidental finding of small hypoattenuating mass measuring 1.6 cm in the pancreatic tail; recommend follow-up contrast CT scan in 6 months   Time spent greater than 35 minutes;> 50% of time with patient was spent reviewing records, labs, tests and studies, counseling and developing plan of care  Thurston Hole D. Lyn Hollingshead, MD

## 2017-10-04 ENCOUNTER — Encounter: Payer: Self-pay | Admitting: Internal Medicine

## 2017-10-04 DIAGNOSIS — K8689 Other specified diseases of pancreas: Secondary | ICD-10-CM | POA: Insufficient documentation

## 2017-10-04 DIAGNOSIS — G9349 Other encephalopathy: Secondary | ICD-10-CM | POA: Insufficient documentation

## 2017-10-04 DIAGNOSIS — E875 Hyperkalemia: Secondary | ICD-10-CM | POA: Insufficient documentation

## 2017-10-08 LAB — BASIC METABOLIC PANEL
BUN: 86 — AB (ref 4–21)
CREATININE: 2.3 — AB (ref 0.6–1.3)
Glucose: 191
POTASSIUM: 5.8 — AB (ref 3.4–5.3)
SODIUM: 143 (ref 137–147)

## 2017-10-15 LAB — BASIC METABOLIC PANEL
BUN: 56 — AB (ref 4–21)
CREATININE: 2.2 — AB (ref 0.6–1.3)
Glucose: 221
Potassium: 4.4 (ref 3.4–5.3)
SODIUM: 129 — AB (ref 137–147)

## 2017-10-29 ENCOUNTER — Non-Acute Institutional Stay (SKILLED_NURSING_FACILITY): Payer: Medicare Other | Admitting: Internal Medicine

## 2017-10-29 DIAGNOSIS — E871 Hypo-osmolality and hyponatremia: Secondary | ICD-10-CM

## 2017-10-30 ENCOUNTER — Encounter: Payer: Self-pay | Admitting: Internal Medicine

## 2017-10-30 ENCOUNTER — Non-Acute Institutional Stay (SKILLED_NURSING_FACILITY): Payer: Medicare Other | Admitting: Internal Medicine

## 2017-10-30 DIAGNOSIS — I25118 Atherosclerotic heart disease of native coronary artery with other forms of angina pectoris: Secondary | ICD-10-CM | POA: Diagnosis not present

## 2017-10-30 DIAGNOSIS — N183 Chronic kidney disease, stage 3 (moderate): Secondary | ICD-10-CM

## 2017-10-30 DIAGNOSIS — I129 Hypertensive chronic kidney disease with stage 1 through stage 4 chronic kidney disease, or unspecified chronic kidney disease: Secondary | ICD-10-CM | POA: Diagnosis not present

## 2017-10-30 DIAGNOSIS — E559 Vitamin D deficiency, unspecified: Secondary | ICD-10-CM

## 2017-10-30 NOTE — Progress Notes (Signed)
Location:  [facility] Room Number: 321P Place of Service:  SNF ([id number])  Randon Goldsmith. Lyn Hollingshead, MD  Patient Care Team: Andreas Blower., MD as PCP - General (Internal Medicine)  Extended Emergency Contact Information Primary Emergency Contact: Kropf,Jean Address: [address]           [place], [place] [address] [place] Home Phone: [phone] Relation: Spouse    Allergies: Norvasc [amlodipine besylate]; Penicillins; Pregabalin; and Fentanyl  Chief Complaint  Patient presents with  . Medical Management of Chronic Issues    Routine Visit    HPI: Patient is 77 y.o. male who is been seen for routine issues of hypertension, coronary artery disease, and vitamin D deficiency.  Past Medical History:  Diagnosis Date  . Abnormal LFTs 11/28/2016  . Acquired spondylolisthesis 09/14/2012  . Acute cholecystitis due to biliary calculus   . Adjustment disorder with depressed mood 03/03/2013  . AKI (acute kidney injury) (HCC) 02/21/2016  . 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 Arthritis   . Back pain   . Benign hypertension with chronic kidney disease, stage III (HCC) 06/14/2015  . CAD (coronary artery disease) 02/22/2015  . Choledocholithiasis   . Chronic kidney disease 02/22/2015  . Chronic kidney disease   . Depressive disorder 09/14/2012  . Diabetes mellitus with stage 3 chronic kidney disease (HCC) 06/14/2015  . Diabetes mellitus without complication (HCC)   . Diabetic macular edema (HCC) 09/12/2012  . 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 Diabetic retinopathy (HCC)   . Disc degeneration, lumbar   . Displacement of lumbar intervertebral disc 09/14/2012  . Elbow pain   . GERD (gastroesophageal reflux disease)   . High cholesterol   . Hypercholesteremia   . Hypertension   . Morbid obesity (HCC) 09/14/2012  . Myocardial  infarction (HCC)   . Neuropathy   . Obesity   . Obstructive sleep apnea 02/22/2015  . Old myocardial infarction 02/22/2015  . Paroxysmal atrial fibrillation (HCC) 11/28/2016  . Pseudogout 12/25/2013   Overview:  Based on Right olecranon bursa synovial fluid analysis  . Renal disorder   . Restrictive lung disease 06/02/2016  . Right posterior capsular opacification 02/04/2015  . RLS (restless legs syndrome) 08/03/2016  . Sleep apnea   . Status post intraocular lens implant 05/15/2013  . Tear of medial cartilage or meniscus of knee, current 09/14/2012  . Uncontrolled type 2 diabetes mellitus with diabetic polyneuropathy, with long-term current use of insulin (HCC) 08/12/2015  . Vitamin D deficiency 06/14/2015    Past Surgical History:  Procedure Laterality Date  . BLEPHAROPLASTY  08/23/2017   Upper lids  . CARDIAC SURGERY  2000   stents placed in heart  . CATARACT EXTRACTION, BILATERAL    . GALLBLADDER SURGERY  12/2016  . GASTROSTOMY TUBE PLACEMENT  09/18/2017  . heart stent    . IR REPLC GASTRO/COLONIC TUBE PERCUT W/FLUORO  09/30/2017  . KNEE SURGERY Left 09/14/2012  . LAPAROSCOPIC CHOLECYSTECTOMY  12/01/2016  . LAPAROSCOPIC CHOLECYSTECTOMY  12/01/2016  . LUMBAR DISC SURGERY    . LUMBAR DISC SURGERY    . YAG CAPSULOTOMY Left 05/29/2017  . YAG CAPSULOTOMY Right 06/13/2017    Allergies as of 10/30/2017      Reactions   Norvasc [amlodipine Besylate]    unknown   Penicillins    unknown   Pregabalin Other (See Comments)  Disoriented and weakness in the legs   Fentanyl Rash   Pt reports skin burn with generic fentanyl - he can use BRAND NAME duragesic without issue       Medication List        Accurate as of 10/30/17 11:59 PM. Always use your most recent med list.          acetaminophen 160 MG/5ML suspension Commonly known as:  TYLENOL Place 650 mg into feeding tube every 6 (six) hours as needed for fever.   albuterol (2.5 MG/3ML) 0.083% nebulizer solution Commonly known  as:  PROVENTIL Take 2.5 mg by nebulization every 6 (six) hours as needed for wheezing or shortness of breath.   aspirin 81 MG chewable tablet Place 81 mg into feeding tube daily.   atorvastatin 40 MG tablet Commonly known as:  LIPITOR Place 40 mg into feeding tube at bedtime.   brimonidine 0.2 % ophthalmic solution Commonly known as:  ALPHAGAN Place 1 drop into both eyes 3 (three) times daily.   carvedilol 6.25 MG tablet Commonly known as:  COREG Place 6.25 mg into feeding tube 2 (two) times daily with a meal.   Cholecalciferol 2000 units Chew Place 2,000 Units into feeding tube daily.   doxazosin 2 MG tablet Commonly known as:  CARDURA Place 2 mg into feeding tube daily.   erythromycin ophthalmic ointment Place 1 application into the right eye 4 (four) times daily.   folic acid 1 MG tablet Commonly known as:  FOLVITE Place 1 mg into feeding tube daily.   free water Soln Place 200 mLs into feeding tube every 8 (eight) hours.   HUMALOG KWIKPEN [place] Inject 1-15 Units into the skin every 6 (six) hours.   hydrALAZINE 50 MG tablet Commonly known as:  APRESOLINE Place 50 mg into feeding tube every 8 (eight) hours.   LANTUS 100 UNIT/ML injection Generic drug:  insulin glargine Inject 24 Units into the skin 2 (two) times daily.   lidocaine 5 % Commonly known as:  LIDODERM Place 1-3 patches onto the skin daily as needed (pain). Remove & Discard patch within 12 hours or as directed by MD   nitroGLYCERIN 0.4 MG SL tablet Commonly known as:  NITROSTAT Place 0.4 mg under the tongue. Place one SL tablet under the tongue every 5 minutes as needed for chest pain for 3 doses   pantoprazole sodium 40 mg/20 mL Pack Commonly known as:  PROTONIX Place 40 mg into feeding tube daily.   polyvinyl alcohol 1.4 % ophthalmic solution Commonly known as:  LIQUIFILM TEARS Place 1 drop into both eyes as needed for dry eyes.   thiamine 100 MG tablet Place 100 mg into feeding tube daily.        No orders of the defined types were placed in this encounter.   Immunization History  Administered Date(s) Administered  . Influenza, High Dose Seasonal PF 01/05/2016  . Pneumococcal Conjugate-13 06/06/2013  . Pneumococcal Polysaccharide-23 02/13/2007  . Tdap 03/07/2010  . Zoster 09/17/2007    Social History   Tobacco Use  . Smoking status: Former Smoker    Packs/day: 1.00    Years: 20.00    Pack years: 20.00    Types: Cigarettes    Last attempt to quit: 09/28/2016    Years since quitting: 1.1  . Smokeless tobacco: Never Used  Substance Use Topics  . Alcohol use: No    Review of Systems unable to obtain secondary to nonverbal status; nursing-no acute concerns    Vitals:  10/30/17 1149  BP: 124/74  Pulse: 84  Resp: 19  Temp: (!) 97 F (36.1 C)   Body mass index is 35.43 kg/m. Physical Exam  GENERAL APPEARANCE: Alert, conversant conversant, No acute distress  SKIN: No diaphoresis rash HEENT: Unremarkable RESPIRATORY: Breathing is even, unlabored. Lung sounds are clear   CARDIOVASCULAR: Heart RRR no murmurs, rubs or gallops. No peripheral edema  GASTROINTESTINAL: Abdomen is soft, non-tender, not distended w/ normal bowel sounds; PEG tube.  GENITOURINARY: Bladder non tender, not distended  MUSCULOSKELETAL: No abnormal joints or musculature NEUROLOGIC: Cranial nerves 2-12 grossly intact step does not speak and has dysphasia. Moves all extremities PSYCHIATRIC: Mood and affect appropriate to situation, no behavioral issues  Patient Active Problem List   Diagnosis Date Noted  . Hyponatremia 11/17/2017  . UTI due to extended-spectrum beta lactamase (ESBL) producing Escherichia coli 11/17/2017  . Hematuria 11/17/2017  . Encephalopathy chronic 10/04/2017  . Hyperkalemia 10/04/2017  . Pancreatic mass 10/04/2017  . Chronic diastolic (congestive) heart failure (HCC) 10/02/2017  . Corneal abrasion, right 10/02/2017  . GERD (gastroesophageal reflux disease)  10/02/2017  . Dysphasia 10/01/2017  . Malfunction of percutaneous endoscopic gastrostomy (PEG) tube (HCC) 09/29/2017  . Acute on chronic respiratory failure with hypoxia and hypercapnia (HCC) 09/29/2017  . Acute metabolic encephalopathy 09/29/2017  . Acute cholecystitis due to biliary calculus 12/17/2016  . Acute urinary retention 12/17/2016  . Acute renal failure superimposed on stage 3 chronic kidney disease (HCC) 12/17/2016  . Hyperchloremic acidosis 12/17/2016  . Hypomagnesemia 12/17/2016  . Pancytopenia (HCC) 12/17/2016  . Hyperlipidemia associated with type 2 diabetes mellitus (HCC) 12/17/2016  . Abnormal LFTs 11/28/2016  . Paroxysmal atrial fibrillation (HCC) 11/28/2016  . RLS (restless legs syndrome) 08/03/2016  . Restrictive lung disease 06/02/2016  . AKI (acute kidney injury) (HCC) 02/21/2016  . Uncontrolled type 2 diabetes mellitus with diabetic polyneuropathy, with long-term current use of insulin (HCC) 08/12/2015  . 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 Benign hypertension with chronic kidney disease, stage III (HCC) 06/14/2015  . Diabetes mellitus with stage 3 chronic kidney disease (HCC) 06/14/2015  . Vitamin D deficiency 06/14/2015  . CAD (coronary artery disease) 02/22/2015  . Chronic kidney disease 02/22/2015  . Obstructive sleep apnea 02/22/2015  . Old myocardial infarction 02/22/2015  . Right posterior capsular opacification 02/04/2015  . Pseudogout 12/25/2013  . Status post intraocular lens implant 05/15/2013  . Adjustment disorder with depressed mood 03/03/2013  . Acquired spondylolisthesis 09/14/2012  . Depressive disorder 09/14/2012  . Displacement of lumbar intervertebral disc 09/14/2012  . Morbid obesity (HCC) 09/14/2012  . Tear of medial cartilage or meniscus of knee, current 09/14/2012  . Diabetic macular edema (HCC) 09/12/2012  . 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 associated with type 2  diabetes mellitus (HCC) 08/01/2012    CMP     Component Value Date/Time   NA 129 (A) 10/15/2017   NA 135 09/26/2017   K 4.4 10/15/2017   K 5.2 09/26/2017   CL 109 10/01/2017 1439   CO2 26 10/01/2017 1439   GLUCOSE 384 (H) 10/01/2017 1439   BUN 56 (A) 10/15/2017   CREATININE 2.2 (A) 10/15/2017   CREATININE 1.92 (H) 10/01/2017 1439   CREATININE 2.17 09/26/2017   CALCIUM 8.6 (L) 10/01/2017 1439   CALCIUM 8.8 09/26/2017   PROT 6.4 (L) 09/29/2017 1839   ALBUMIN 2.7 (L) 09/29/2017 1839   AST 54 (H) 09/29/2017 1839   ALT 48 09/29/2017 1839  ALKPHOS 198 (H) 09/29/2017 1839   BILITOT 0.5 09/29/2017 1839   GFRNONAA 32 (L) 10/01/2017 1439   GFRNONAA 27.98 09/26/2017   GFRAA 37 (L) 10/01/2017 1439   Recent Labs    09/30/17 0403 10/01/17 0434 10/01/17 1439 10/03/17 10/08/17 10/15/17  NA 143 141 143 147 143 129*  K 5.3* 5.4* 5.0 4.6 5.8* 4.4  CL 108 109 109  --   --   --   CO2 26 25 26   --   --   --   GLUCOSE 194* 326* 384*  --   --   --   BUN 52* 54* 50* 55* 86* 56*  CREATININE 1.92* 2.01* 1.92* 2.1* 2.3* 2.2*  CALCIUM 8.9 8.9 8.6*  --   --   --    Recent Labs    11/27/16 2035 09/08/17 09/29/17 1839  AST 108* 36 54*  ALT 113* 21 48  ALKPHOS 254* 170* 198*  BILITOT 4.5*  --  0.5  PROT 6.5  --  6.4*  ALBUMIN 3.4*  --  2.7*   Recent Labs    11/27/16 2035  09/26/17 09/29/17 1839 09/30/17 0403 10/03/17 11/15/17  WBC 3.7*   < > 5.6 6.4 7.8 6.4 4.3  NEUTROABS 2.8  --   --  4.9  --   --   --   HGB 9.7*   < > 8.8 8.1* 9.5* 9.0* 8.0*  HCT 29.1*   < > 26.9 26.1* 30.3* 28* 24*  MCV 87.1  --  85.9 89.4 88.3  --   --   PLT 95*   < >  --  160 151 132* 144*   < > = values in this interval not displayed.   No results for input(s): CHOL, LDLCALC, TRIG in the last 8760 hours.  Invalid input(s): HCL No results found for: MICROALBUR No results found for: TSH No results found for: HGBA1C No results found for: CHOL, HDL, LDLCALC, LDLDIRECT, TRIG, CHOLHDL  Significant  Diagnostic Results in last 30 days:  No results found.  Assessment and Plan  Benign hypertension with chronic kidney disease, stage III Controlled; continue hydralazine 50 mg 3 times daily and Coreg 6.25 mg twice daily  CAD (coronary artery disease) No chest pain or equivalent; continue Coreg 6.25 mg twice daily, ASA 81 mg daily with sublingual nitroglycerin as needed; patient is on statin  Vitamin D deficiency Chronic and stable; continue vitamin D replacement at 2000 units per tube daily     Anne D. Lyn Hollingshead, MD

## 2017-11-12 ENCOUNTER — Non-Acute Institutional Stay (SKILLED_NURSING_FACILITY): Payer: Medicare Other | Admitting: Internal Medicine

## 2017-11-12 ENCOUNTER — Encounter: Payer: Self-pay | Admitting: Internal Medicine

## 2017-11-12 DIAGNOSIS — E785 Hyperlipidemia, unspecified: Secondary | ICD-10-CM

## 2017-11-12 DIAGNOSIS — R338 Other retention of urine: Secondary | ICD-10-CM

## 2017-11-12 DIAGNOSIS — N183 Chronic kidney disease, stage 3 unspecified: Secondary | ICD-10-CM

## 2017-11-12 DIAGNOSIS — E1122 Type 2 diabetes mellitus with diabetic chronic kidney disease: Secondary | ICD-10-CM

## 2017-11-12 DIAGNOSIS — E875 Hyperkalemia: Secondary | ICD-10-CM

## 2017-11-12 DIAGNOSIS — E871 Hypo-osmolality and hyponatremia: Secondary | ICD-10-CM | POA: Diagnosis not present

## 2017-11-12 DIAGNOSIS — E1169 Type 2 diabetes mellitus with other specified complication: Secondary | ICD-10-CM

## 2017-11-12 DIAGNOSIS — N390 Urinary tract infection, site not specified: Secondary | ICD-10-CM

## 2017-11-12 DIAGNOSIS — B9629 Other Escherichia coli [E. coli] as the cause of diseases classified elsewhere: Secondary | ICD-10-CM

## 2017-11-12 DIAGNOSIS — Z1612 Extended spectrum beta lactamase (ESBL) resistance: Secondary | ICD-10-CM

## 2017-11-12 DIAGNOSIS — R310 Gross hematuria: Secondary | ICD-10-CM

## 2017-11-12 DIAGNOSIS — I129 Hypertensive chronic kidney disease with stage 1 through stage 4 chronic kidney disease, or unspecified chronic kidney disease: Secondary | ICD-10-CM

## 2017-11-12 NOTE — Progress Notes (Signed)
:    Location:  [facility] Room Number: 321P Place of Service:  SNF (31)  Avamarie Crossley D. Lyn Hollingshead, MD  Patient Care Team: Andreas Blower., MD as PCP - General (Internal Medicine)  Extended Emergency Contact Information Primary Emergency Contact: Sekula,Jean Address: [address]           [place], [place] [address] [place] Home Phone: [phone] Relation: Spouse     Allergies: Norvasc [amlodipine besylate]; Penicillins; Pregabalin; and Fentanyl  Chief Complaint  Patient presents with  . Readmit To SNF    Admit to [facility]    HPI: Patient is 77 y.o. male with hypertension, anemia of chronic disease, type 2 diabetes, OSA on CPAP, who was sent from skilled nursing facility with a sodium of 114.  Patient had been treated in house with sodium bicarb per tube with improvement in sodium to 120, however when after the second day of sodium bicarb sodium dropped down to 114 patient was sent to [facility].  Patient was admitted to [facility] from 7/24-8/2 where he was treated for hypo-natremia with 3% saline and mild hyperkalemia treated with insulin and dextrose.  Patient was also found to have an ESBL E. coli infection which was treated with fosfomycin after initial dose of Invanz after discussion with infectious disease.  Patient also had acute urinary retention and hematuria and was recommended to continue Foley catheter and to follow-up as outpatient.  Patient is admitted back to skilled nursing facility for residential care while at skilled nursing facility patient will be followed for hypertension treated with Coreg Apresoline, diabetes mellitus treated with Lantus and Humalog insulin, and hyperlipidemia treated with Lipitor.  Past Medical History:  Diagnosis Date  . Abnormal LFTs 11/28/2016  . Acquired spondylolisthesis 09/14/2012  . Acute cholecystitis due to biliary calculus   . Adjustment disorder with  depressed mood 03/03/2013  . AKI (acute kidney injury) (HCC) 02/21/2016  . 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 Arthritis   . Back pain   . Benign hypertension with chronic kidney disease, stage III (HCC) 06/14/2015  . CAD (coronary artery disease) 02/22/2015  . Choledocholithiasis   . Chronic kidney disease 02/22/2015  . Chronic kidney disease   . Depressive disorder 09/14/2012  . Diabetes mellitus with stage 3 chronic kidney disease (HCC) 06/14/2015  . Diabetes mellitus without complication (HCC)   . Diabetic macular edema (HCC) 09/12/2012  . 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 Diabetic retinopathy (HCC)   . Disc degeneration, lumbar   . Displacement of lumbar intervertebral disc 09/14/2012  . Elbow pain   . GERD (gastroesophageal reflux disease)   . High cholesterol   . Hypercholesteremia   . Hypertension   . Morbid obesity (HCC) 09/14/2012  . Myocardial infarction (HCC)   . Neuropathy   . Obesity   . Obstructive sleep apnea 02/22/2015  . Old myocardial infarction 02/22/2015  . Paroxysmal atrial fibrillation (HCC) 11/28/2016  . Pseudogout 12/25/2013   Overview:  Based on Right olecranon bursa synovial fluid analysis  . Renal disorder   . Restrictive lung disease 06/02/2016  . Right posterior capsular opacification 02/04/2015  . RLS (restless legs syndrome) 08/03/2016  . Sleep apnea   . Status post intraocular lens implant 05/15/2013  . Tear of medial cartilage or meniscus of knee, current 09/14/2012  .  Uncontrolled type 2 diabetes mellitus with diabetic polyneuropathy, with long-term current use of insulin (HCC) 08/12/2015  . Vitamin D deficiency 06/14/2015    Past Surgical History:  Procedure Laterality Date  . BLEPHAROPLASTY  08/23/2017   Upper lids  . CARDIAC SURGERY  2000   stents placed in heart  . CATARACT EXTRACTION, BILATERAL    . GALLBLADDER SURGERY   12/2016  . GASTROSTOMY TUBE PLACEMENT  09/18/2017  . heart stent    . IR REPLC GASTRO/COLONIC TUBE PERCUT W/FLUORO  09/30/2017  . KNEE SURGERY Left 09/14/2012  . LAPAROSCOPIC CHOLECYSTECTOMY  12/01/2016  . LAPAROSCOPIC CHOLECYSTECTOMY  12/01/2016  . LUMBAR DISC SURGERY    . LUMBAR DISC SURGERY    . YAG CAPSULOTOMY Left 05/29/2017  . YAG CAPSULOTOMY Right 06/13/2017    Allergies as of 11/12/2017      Reactions   Norvasc [amlodipine Besylate]    unknown   Penicillins    unknown   Pregabalin Other (See Comments)   Disoriented and weakness in the legs   Fentanyl Rash   Pt reports skin burn with generic fentanyl - he can use BRAND NAME duragesic without issue       Medication List        Accurate as of 11/12/17 12:41 PM. Always use your most recent med list.          acetaminophen 160 MG/5ML suspension Commonly known as:  TYLENOL Place 650 mg into feeding tube every 6 (six) hours as needed for fever.   albuterol (2.5 MG/3ML) 0.083% nebulizer solution Commonly known as:  PROVENTIL Take 2.5 mg by nebulization every 6 (six) hours as needed for wheezing or shortness of breath.   aspirin 81 MG chewable tablet Place 81 mg into feeding tube daily.   atorvastatin 40 MG tablet Commonly known as:  LIPITOR Place 40 mg into feeding tube at bedtime.   brimonidine 0.2 % ophthalmic solution Commonly known as:  ALPHAGAN Place 1 drop into both eyes 3 (three) times daily.   carvedilol 6.25 MG tablet Commonly known as:  COREG Place 6.25 mg into feeding tube 2 (two) times daily with a meal.   Cholecalciferol 2000 units Chew Place 2,000 Units into feeding tube daily.   docusate 50 MG/5ML liquid Commonly known as:  COLACE Take by mouth 2 (two) times daily.   erythromycin ophthalmic ointment Place 1 application into the right eye 4 (four) times daily.   folic acid 1 MG tablet Commonly known as:  FOLVITE Place 1 mg into feeding tube daily.   HUMALOG KWIKPEN [place] Inject 1-15 Units  into the skin every 6 (six) hours.   hydrALAZINE 50 MG tablet Commonly known as:  APRESOLINE Place 50 mg into feeding tube every 8 (eight) hours.   LANTUS 100 UNIT/ML injection Generic drug:  insulin glargine Inject 24 Units into the skin 2 (two) times daily.   lidocaine 5 % Commonly known as:  LIDODERM Place 1-3 patches onto the skin daily as needed (pain). Remove & Discard patch within 12 hours or as directed by MD   nitroGLYCERIN 0.4 MG SL tablet Commonly known as:  NITROSTAT Place 0.4 mg under the tongue. Place one SL tablet under the tongue every 5 minutes as needed for chest pain for 3 doses   pantoprazole sodium 40 mg/20 mL Pack Commonly known as:  PROTONIX Place 40 mg into feeding tube daily.   polyvinyl alcohol 1.4 % ophthalmic solution Commonly known as:  LIQUIFILM TEARS Place 1 drop into both eyes  as needed for dry eyes.   SUPLENA/CARB STEADY PO Take by mouth. By PEG tube route. 7245ml/hr via peg x 24 hrs   thiamine 100 MG tablet Place 100 mg into feeding tube daily.       No orders of the defined types were placed in this encounter.   Immunization History  Administered Date(s) Administered  . Influenza, High Dose Seasonal PF 01/05/2016  . Pneumococcal Conjugate-13 06/06/2013  . Pneumococcal Polysaccharide-23 02/13/2007  . Tdap 03/07/2010  . Zoster 09/17/2007    Social History   Tobacco Use  . Smoking status: Former Smoker    Packs/day: 1.00    Years: 20.00    Pack years: 20.00    Types: Cigarettes    Last attempt to quit: 09/28/2016    Years since quitting: 1.1  . Smokeless tobacco: Never Used  Substance Use Topics  . Alcohol use: No    Family history is   Family History  Problem Relation Age of Onset  . Diabetes Mother   . Hypertension Mother   . Hypertension Father   . Stroke Father   . Cancer Sister        bowel  . Diabetes Maternal Grandmother       Review of Systems unable to obtain secondary to patient's permanent  encephalopathic and nonverbal condition: Nursing-no acute concerns      Vitals:   11/12/17 1226  BP: 137/65  Pulse: 70  Resp: 20  Temp: 97.8 F (36.6 C)    SpO2 Readings from Last 1 Encounters:  10/01/17 99%   Body mass index is 16.13 kg/m.     Physical Exam  GENERAL APPEARANCE: Alert, non conversant,  No acute distress.  SKIN: No diaphoresis rash HEAD: Normocephalic, atraumatic  EYES: Conjunctiva/lids clear. Pupils round, reactive. EOMs intact.  EARS: External exam WNL, canals clear. Hearing grossly normal.  NOSE: No deformity or discharge.  MOUTH/THROAT: Lips w/o lesions  RESPIRATORY: Breathing is even, unlabored. Lung sounds are clear   CARDIOVASCULAR: Heart RRR no murmurs, rubs or gallops.  Trace peripheral edema.   GASTROINTESTINAL: Abdomen is soft, non-tender, not distended w/ normal bowel sounds; PEG tube. GENITOURINARY: Bladder non tender, not distended  MUSCULOSKELETAL: No abnormal joints or musculature NEUROLOGIC: Primarily encephalopathic and nonverbal PSYCHIATRIC: Not applicable, no behavioral issues  Patient Active Problem List   Diagnosis Date Noted  . Encephalopathy chronic 10/04/2017  . Hyperkalemia 10/04/2017  . Pancreatic mass 10/04/2017  . Chronic diastolic (congestive) heart failure (HCC) 10/02/2017  . Corneal abrasion, right 10/02/2017  . GERD (gastroesophageal reflux disease) 10/02/2017  . Dysphasia 10/01/2017  . Malfunction of percutaneous endoscopic gastrostomy (PEG) tube (HCC) 09/29/2017  . Acute on chronic respiratory failure with hypoxia and hypercapnia (HCC) 09/29/2017  . Acute metabolic encephalopathy 09/29/2017  . Acute cholecystitis due to biliary calculus 12/17/2016  . Acute urinary retention 12/17/2016  . Acute renal failure superimposed on stage 3 chronic kidney disease (HCC) 12/17/2016  . Hyperchloremic acidosis 12/17/2016  . Hypomagnesemia 12/17/2016  . Pancytopenia (HCC) 12/17/2016  . Hyperlipidemia associated with type 2  diabetes mellitus (HCC) 12/17/2016  . Abnormal LFTs 11/28/2016  . Paroxysmal atrial fibrillation (HCC) 11/28/2016  . RLS (restless legs syndrome) 08/03/2016  . Restrictive lung disease 06/02/2016  . AKI (acute kidney injury) (HCC) 02/21/2016  . Uncontrolled type 2 diabetes mellitus with diabetic polyneuropathy, with long-term current use of insulin (HCC) 08/12/2015  . 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 Benign hypertension with chronic kidney disease, stage III (  HCC) 06/14/2015  . Diabetes mellitus with stage 3 chronic kidney disease (HCC) 06/14/2015  . Vitamin D deficiency 06/14/2015  . CAD (coronary artery disease) 02/22/2015  . Chronic kidney disease 02/22/2015  . Obstructive sleep apnea 02/22/2015  . Old myocardial infarction 02/22/2015  . Right posterior capsular opacification 02/04/2015  . Pseudogout 12/25/2013  . Status post intraocular lens implant 05/15/2013  . Adjustment disorder with depressed mood 03/03/2013  . Acquired spondylolisthesis 09/14/2012  . Depressive disorder 09/14/2012  . Displacement of lumbar intervertebral disc 09/14/2012  . Morbid obesity (HCC) 09/14/2012  . Tear of medial cartilage or meniscus of knee, current 09/14/2012  . Diabetic macular edema (HCC) 09/12/2012  . 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 associated with type 2 diabetes mellitus (HCC) 08/01/2012      Labs reviewed: Basic Metabolic Panel:    Component Value Date/Time   NA 129 (A) 10/15/2017   NA 135 09/26/2017   K 4.4 10/15/2017   K 5.2 09/26/2017   CL 109 10/01/2017 1439   CO2 26 10/01/2017 1439   GLUCOSE 384 (H) 10/01/2017 1439   BUN 56 (A) 10/15/2017   CREATININE 2.2 (A) 10/15/2017   CREATININE 1.92 (H) 10/01/2017 1439   CREATININE 2.17 09/26/2017   CALCIUM 8.6 (L) 10/01/2017 1439   CALCIUM 8.8 09/26/2017   PROT 6.4 (L) 09/29/2017 1839   ALBUMIN 2.7 (L) 09/29/2017 1839   AST 54 (H) 09/29/2017 1839   ALT 48  09/29/2017 1839   ALKPHOS 198 (H) 09/29/2017 1839   BILITOT 0.5 09/29/2017 1839   GFRNONAA 32 (L) 10/01/2017 1439   GFRNONAA 27.98 09/26/2017   GFRAA 37 (L) 10/01/2017 1439    Recent Labs    09/30/17 0403 10/01/17 0434 10/01/17 1439 10/03/17 10/08/17 10/15/17  NA 143 141 143 147 143 129*  K 5.3* 5.4* 5.0 4.6 5.8* 4.4  CL 108 109 109  --   --   --   CO2 26 25 26   --   --   --   GLUCOSE 194* 326* 384*  --   --   --   BUN 52* 54* 50* 55* 86* 56*  CREATININE 1.92* 2.01* 1.92* 2.1* 2.3* 2.2*  CALCIUM 8.9 8.9 8.6*  --   --   --    Liver Function Tests: Recent Labs    11/27/16 2035 09/08/17 09/29/17 1839  AST 108* 36 54*  ALT 113* 21 48  ALKPHOS 254* 170* 198*  BILITOT 4.5*  --  0.5  PROT 6.5  --  6.4*  ALBUMIN 3.4*  --  2.7*   Recent Labs    11/27/16 2035  LIPASE 26   Recent Labs    09/30/17 0003  AMMONIA 23   CBC: Recent Labs    11/27/16 2035  09/26/17 09/29/17 1839 09/30/17 0403 10/03/17  WBC 3.7*   < > 5.6 6.4 7.8 6.4  NEUTROABS 2.8  --   --  4.9  --   --   HGB 9.7*   < > 8.8 8.1* 9.5* 9.0*  HCT 29.1*   < > 26.9 26.1* 30.3* 28*  MCV 87.1  --  85.9 89.4 88.3  --   PLT 95*   < >  --  160 151 132*   < > = values in this interval not displayed.   Lipid No results for input(s): CHOL, HDL, LDLCALC, TRIG in the last 8760 hours.  Cardiac Enzymes: Recent Labs    11/27/16 2035  TROPONINI 0.06*  BNP: Recent Labs    11/27/16 2035  BNP 207.7*   No results found for: MICROALBUR No results found for: HGBA1C No results found for: TSH No results found for: VITAMINB12 No results found for: FOLATE No results found for: IRON, TIBC, FERRITIN  Imaging and Procedures obtained prior to SNF admission: Ct Abdomen Pelvis Wo Contrast  Result Date: 09/29/2017 CLINICAL DATA:  reportedly the G-Tube came out as staff were doing peri care on pt. He is agitated something EMS states that's baseline secondary to hx of dementia. EXAM: CT ABDOMEN AND PELVIS WITHOUT CONTRAST  TECHNIQUE: Multidetector CT imaging of the abdomen and pelvis was performed following the standard protocol without IV contrast. COMPARISON:  None. FINDINGS: Lower chest: Heart is mildly enlarged. Mild lung base scarring and/or subsegmental atelectasis. No acute findings. Is Hepatobiliary: Liver normal in overall size. No liver mass or focal lesion. There are morphologic changes with central volume loss and relative enlargement of the lateral segment left lobe that raises possibility of cirrhosis. Gallbladder surgically absent. No bile duct dilation. Pancreas: 16 mm hypoattenuating mass arises from pancreatic tail. This is not clearly cystic and incompletely characterized on this unenhanced study. No other pancreatic masses. No pancreatic inflammation. Spleen: Spleen borderline enlarged measuring 12 cm in long axis. No splenic masses or focal lesions. Adrenals/Urinary Tract: No adrenal masses. Right kidney is inferiorly located. It demonstrates a lobulated contour. There is no defined right renal mass. No collecting system stone. No hydronephrosis. Left kidney is normal in size and position. No left renal mass, stone or hydronephrosis. Ureters are normal in course and in caliber. Bladder is distended.  No bladder wall thickening, masses or stones. Stomach/Bowel: Anterior aspect of the stomach is retracted against the anterior peritoneal cavity adjacent to a gastrostomy tract. There is no extraluminal air or fluid. Stomach otherwise unremarkable. Bowel is normal in caliber. No bowel wall thickening or inflammatory changes. Vascular/Lymphatic: There several prominent gastrohepatic ligament lymph nodes, none pathologically enlarged by size criteria. No enlarged lymph nodes. Dense atherosclerotic calcifications are noted along the abdominal aorta and its branch vessels. No aneurysm. Reproductive: Unremarkable. Other: No fluid collection is seen along the gastrostomy tube tract. No abdominal wall hernia. No ascites. No  evidence of an abdominal abscess. Musculoskeletal: L5 is a transitional lumbosacral vertebra. 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 No acute fracture. No osteoblastic or osteolytic lesions. IMPRESSION: 1. No acute findings within the abdomen or pelvis. 2. The gastrostomy tract is visualized. There is no free intraperitoneal air, ascites or fluid collection to suggest a complication related to the gastrostomy tube. 3. 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 Borderline splenomegaly. 4. Small hypoattenuating mass measuring 1.6 cm arises from the pancreatic tail. This could reflect a neoplasm. It could be cystic but is not well-defined on this unenhanced study. Recommend follow-up CT in 6 months, with contrast if the patient can tolerate contrast. 5. Dense aortic atherosclerosis. 6. Ptotic right kidney. No hydronephrosis or acute renal abnormality. 7. Distended bladder. 8. 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 Electronically Signed   By: Amie Portland M.D.   On: 09/29/2017 18:39   Ir Replc Gastro/colonic Tube Percut W/fluoro  Result Date: 09/30/2017 INDICATION: History of end-stage dementia with recently placed gastrostomy tube, inadvertently removed. Request made for fluoroscopic guided gastrostomy tube placement. Gastrostomy tube placed at an outside institution. EXAM: FLUOROSCOPIC GUIDED REPLACEMENT OF GASTROSTOMY TUBE COMPARISON:  CT abdomen  and pelvis - 09/29/2017 MEDICATIONS: None. CONTRAST:  15 cc Isovue-300 - administered into the gastric lumen FLUOROSCOPY TIME:  2 minutes 6 seconds (50 mGy) COMPLICATIONS: None immediate. PROCEDURE: The site of the prior gastrostomy tube was cannulated with a Christmas tree adapter with contrast injection demonstrating atretic though patent gastrostomy track to the level of the stomach. A Kumpe catheter was advanced through the gastrostomy track to the level of  the stomach and distal esophagus. Contrast injection confirmed appropriate positioning. Next, over a short Amplatz wire, a new, 18 French balloon inflatable gastrostomy tube was inserted through the track. The balloon was inflated and disc was cinched. Contrast was injected and several spot fluoroscopic images were obtained in various obliquities to confirm appropriate intraluminal positioning. A dressing was placed. The patient tolerated the procedure well without immediate postprocedural complication. IMPRESSION: Successful fluoroscopic guided replacement of a new 18-French gastrostomy tube. The new gastrostomy tube is ready for immediate use. Electronically Signed   By: Simonne Come M.D.   On: 09/30/2017 11:30   Dg Chest Port 1 View  Result Date: 09/29/2017 CLINICAL DATA:  Dysphagia. EXAM: PORTABLE CHEST 1 VIEW COMPARISON:  None. FINDINGS: The patient is rotated. Heart size is likely accentuated by technique and rotation, as it is normal on CT from same day. Atherosclerotic calcification of the aortic arch. Normal pulmonary vascularity. No focal consolidation, pleural effusion, or pneumothorax. No acute osseous abnormality. IMPRESSION: 1. No active disease. 2.  Aortic atherosclerosis (ICD10-I70.0). Electronically Signed   By: Obie Dredge M.D.   On: 09/29/2017 21:01     Not all labs, radiology exams or other studies done during hospitalization come through on my EPIC note; however they are reviewed by me.    Assessment and Plan  Hyponatremia/hyperkalemia- felt to be multifactorial including Seroquel and volume depletion: Treated with 3% saline and switch normal saline with improvement of sodium level patient's hyperkalemia improved with insulin and dextrose SNF -admitted for residential care; continue PEG tube feedings with nephro; we will follow-up BMP  ES BL E. coli UTI-initially treated with Invanz then converted over to fosfomycin after discussion with ID; fosfomycin course  finished  Acute urinary retention/hematuria- both resolved with Foley catheter which will remain until patient has outpatient urology follow-up SNF -continue Foley until follow-up with urology; will follow-up CBC  Hypertension SNF -controlled; continue Coreg 25 mg per tube twice daily and hydralazine 100 mg per tube 3 times daily  Diabetes mellitus type 2-not stated as uncontrolled SNF -continue Lantus 10 units twice daily and Humalog sliding scale insulin  Hyperlipidemia SNF -no status uncontrolled; continue 40 mg per tube daily   Time spent greater than 35 minutes;> 50% of time with patient was spent reviewing records, labs, tests and studies, counseling and developing plan of care  Thurston Hole D. Lyn Hollingshead, MD

## 2017-11-15 LAB — CBC AND DIFFERENTIAL
HCT: 24 — AB (ref 41–53)
Hemoglobin: 8 — AB (ref 13.5–17.5)
PLATELETS: 144 — AB (ref 150–399)
WBC: 4.3

## 2017-11-17 ENCOUNTER — Encounter: Payer: Self-pay | Admitting: Internal Medicine

## 2017-11-17 DIAGNOSIS — R319 Hematuria, unspecified: Secondary | ICD-10-CM | POA: Insufficient documentation

## 2017-11-17 DIAGNOSIS — B9629 Other Escherichia coli [E. coli] as the cause of diseases classified elsewhere: Secondary | ICD-10-CM | POA: Insufficient documentation

## 2017-11-17 DIAGNOSIS — N390 Urinary tract infection, site not specified: Secondary | ICD-10-CM

## 2017-11-17 DIAGNOSIS — E871 Hypo-osmolality and hyponatremia: Secondary | ICD-10-CM | POA: Insufficient documentation

## 2017-11-17 DIAGNOSIS — Z1612 Extended spectrum beta lactamase (ESBL) resistance: Secondary | ICD-10-CM

## 2017-11-20 ENCOUNTER — Encounter: Payer: Self-pay | Admitting: Internal Medicine

## 2017-11-20 NOTE — Assessment & Plan Note (Signed)
Controlled; continue hydralazine 50 mg 3 times daily and Coreg 6.25 mg twice daily

## 2017-11-20 NOTE — Assessment & Plan Note (Signed)
No chest pain or equivalent; continue Coreg 6.25 mg twice daily, ASA 81 mg daily with sublingual nitroglycerin as needed; patient is on statin

## 2017-11-20 NOTE — Progress Notes (Signed)
Location:   [facility] [employer], industrial/product of Service:   Skilled nursing facility Provider:Tahjai Schetter D Yitzchak Kothari MD   Andreas Blower., MD  Patient Care Team: Andreas Blower., MD as PCP - General (Internal Medicine)  Extended Emergency Contact Information Primary Emergency Contact: Halberg,Jean Address: [address]           [place], [place] [address] [place] Home Phone: [phone] Relation: Spouse    Allergies: Norvasc [amlodipine besylate]; Penicillins; Pregabalin; and Fentanyl  Chief Complaint  Patient presents with  . Acute Visit    HPI: Patient is 77 y.o. male who being seen for sodium of 117.  Patient is a little lethargic today; patient is getting feedings per feeding tube with regular water flushes.  Past Medical History:  Diagnosis Date  . Abnormal LFTs 11/28/2016  . Acquired spondylolisthesis 09/14/2012  . Acute cholecystitis due to biliary calculus   . Adjustment disorder with depressed mood 03/03/2013  . AKI (acute kidney injury) (HCC) 02/21/2016  . 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 Arthritis   . Back pain   . Benign hypertension with chronic kidney disease, stage III (HCC) 06/14/2015  . CAD (coronary artery disease) 02/22/2015  . Choledocholithiasis   . Chronic kidney disease 02/22/2015  . Chronic kidney disease   . Depressive disorder 09/14/2012  . Diabetes mellitus with stage 3 chronic kidney disease (HCC) 06/14/2015  . Diabetes mellitus without complication (HCC)   . Diabetic macular edema (HCC) 09/12/2012  . 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 Diabetic retinopathy (HCC)   . Disc degeneration, lumbar   . Displacement of lumbar intervertebral disc 09/14/2012  . Elbow pain   . GERD (gastroesophageal reflux disease)   . High cholesterol   . Hypercholesteremia   . Hypertension   . Morbid obesity  (HCC) 09/14/2012  . Myocardial infarction (HCC)   . Neuropathy   . Obesity   . Obstructive sleep apnea 02/22/2015  . Old myocardial infarction 02/22/2015  . Paroxysmal atrial fibrillation (HCC) 11/28/2016  . Pseudogout 12/25/2013   Overview:  Based on Right olecranon bursa synovial fluid analysis  . Renal disorder   . Restrictive lung disease 06/02/2016  . Right posterior capsular opacification 02/04/2015  . RLS (restless legs syndrome) 08/03/2016  . Sleep apnea   . Status post intraocular lens implant 05/15/2013  . Tear of medial cartilage or meniscus of knee, current 09/14/2012  . Uncontrolled type 2 diabetes mellitus with diabetic polyneuropathy, with long-term current use of insulin (HCC) 08/12/2015  . Vitamin D deficiency 06/14/2015    Past Surgical History:  Procedure Laterality Date  . BLEPHAROPLASTY  08/23/2017   Upper lids  . CARDIAC SURGERY  2000   stents placed in heart  . CATARACT EXTRACTION, BILATERAL    . GALLBLADDER SURGERY  12/2016  . GASTROSTOMY TUBE PLACEMENT  09/18/2017  . heart stent    . IR REPLC GASTRO/COLONIC TUBE PERCUT W/FLUORO  09/30/2017  . KNEE SURGERY Left 09/14/2012  . LAPAROSCOPIC CHOLECYSTECTOMY  12/01/2016  . LAPAROSCOPIC CHOLECYSTECTOMY  12/01/2016  . LUMBAR DISC SURGERY    . LUMBAR DISC SURGERY    . YAG CAPSULOTOMY Left 05/29/2017  . YAG CAPSULOTOMY Right 06/13/2017    Allergies as of 10/29/2017      Reactions   Norvasc [amlodipine Besylate]    unknown   Penicillins    unknown  Pregabalin Other (See Comments)   Disoriented and weakness in the legs   Fentanyl Rash   Pt reports skin burn with generic fentanyl - he can use BRAND NAME duragesic without issue       Medication List        Accurate as of 10/29/17 11:59 PM. Always use your most recent med list.          acetaminophen 160 MG/5ML suspension Commonly known as:  TYLENOL Place 650 mg into feeding tube every 6 (six) hours as needed for fever.   albuterol 108 (90 Base) MCG/ACT  inhaler Commonly known as:  PROVENTIL HFA;VENTOLIN HFA Inhale 2 puffs into the lungs every 6 (six) hours as needed for wheezing.   aspirin 81 MG chewable tablet Place 81 mg into feeding tube daily.   atorvastatin 40 MG tablet Commonly known as:  LIPITOR Place 40 mg into feeding tube at bedtime.   brimonidine 0.2 % ophthalmic solution Commonly known as:  ALPHAGAN Place 1 drop into both eyes 3 (three) times daily.   carvedilol 6.25 MG tablet Commonly known as:  COREG Place 6.25 mg into feeding tube 2 (two) times daily with a meal.   Cholecalciferol 2000 units Chew Place 2,000 Units into feeding tube daily.   ciprofloxacin 0.3 % ophthalmic solution Commonly known as:  CILOXAN Place 2 drops into the right eye every 4 (four) hours while awake. Administer 1 drop, every 2 hours, while awake, for 2 days. Then 1 drop, every 4 hours, while awake, for the next 5 days.   docusate 50 MG/5ML liquid Commonly known as:  COLACE Place 100 mg into feeding tube 2 (two) times daily.   doxazosin 2 MG tablet Commonly known as:  CARDURA Place 2 mg into feeding tube daily.   erythromycin ophthalmic ointment Place 1 application into the right eye 4 (four) times daily.   feeding supplement (GLUCERNA 1.2 CAL) Liqd Place 1,000 mLs into feeding tube continuous. Continuous feeding via PEG at 48mLs/hour until Glucerna 1.5 is available   folic acid 1 MG tablet Commonly known as:  FOLVITE Place 1 mg into feeding tube daily.   free water Soln Place 200 mLs into feeding tube every 8 (eight) hours.   HUMALOG KWIKPEN [place] Inject 1-15 Units into the skin every 6 (six) hours.   hydrALAZINE 50 MG tablet Commonly known as:  APRESOLINE Place 50 mg into feeding tube every 8 (eight) hours.   LANTUS 100 UNIT/ML injection Generic drug:  insulin glargine Inject 24 Units into the skin 2 (two) times daily.   lidocaine 5 % Commonly known as:  LIDODERM Place 1-3 patches onto the skin daily as needed (pain).  Remove & Discard patch within 12 hours or as directed by MD   nitroGLYCERIN 0.4 MG SL tablet Commonly known as:  NITROSTAT Place 0.4 mg under the tongue. Place one SL tablet under the tongue every 5 minutes as needed for chest pain for 3 doses   pantoprazole sodium 40 mg/20 mL Pack Commonly known as:  PROTONIX Place 40 mg into feeding tube daily.   polyvinyl alcohol 1.4 % ophthalmic solution Commonly known as:  LIQUIFILM TEARS Place 1 drop into both eyes as needed for dry eyes.   thiamine 100 MG tablet Place 100 mg into feeding tube daily.       No orders of the defined types were placed in this encounter.   Immunization History  Administered Date(s) Administered  . Influenza, High Dose Seasonal PF 01/05/2016  . Pneumococcal  Conjugate-13 06/06/2013  . Pneumococcal Polysaccharide-23 02/13/2007  . Tdap 03/07/2010  . Zoster 09/17/2007    Social History   Tobacco Use  . Smoking status: Former Smoker    Packs/day: 1.00    Years: 20.00    Pack years: 20.00    Types: Cigarettes    Last attempt to quit: 09/28/2016    Years since quitting: 1.1  . Smokeless tobacco: Never Used  Substance Use Topics  . Alcohol use: No    Review of Systems unable to obtain secondary to somnolent and nonverbal status; nursing- concern for sleepy today   Vitals:   11/20/17 1309  BP: 124/74  Pulse: 84  Resp: 19  Temp: (!) 97 F (36.1 C)   Body mass index is 35.4 kg/m. Physical Exam  GENERAL APPEARANCE: Sleepy/nonconversant, No acute distress  SKIN: No diaphoresis rash HEENT: Unremarkable RESPIRATORY: Breathing is even, unlabored. Lung sounds are clear   CARDIOVASCULAR: Heart RRR no murmurs, rubs or gallops. No peripheral edema  GASTROINTESTINAL: Abdomen is soft, non-tender, not distended w/ normal bowel sounds.  G-tube in place GENITOURINARY: Bladder non tender, not distended  MUSCULOSKELETAL: No abnormal joints or musculature NEUROLOGIC: Cranial nerves 2-12 except patient does  not speak and patient has dysphasia moves all extremities PSYCHIATRIC: Mood and affect appropriate to situation, no behavioral issues  Patient Active Problem List   Diagnosis Date Noted  . Hyponatremia 11/17/2017  . UTI due to extended-spectrum beta lactamase (ESBL) producing Escherichia coli 11/17/2017  . Hematuria 11/17/2017  . Encephalopathy chronic 10/04/2017  . Hyperkalemia 10/04/2017  . Pancreatic mass 10/04/2017  . Chronic diastolic (congestive) heart failure (HCC) 10/02/2017  . Corneal abrasion, right 10/02/2017  . GERD (gastroesophageal reflux disease) 10/02/2017  . Dysphasia 10/01/2017  . Malfunction of percutaneous endoscopic gastrostomy (PEG) tube (HCC) 09/29/2017  . Acute on chronic respiratory failure with hypoxia and hypercapnia (HCC) 09/29/2017  . Acute metabolic encephalopathy 09/29/2017  . Acute cholecystitis due to biliary calculus 12/17/2016  . Acute urinary retention 12/17/2016  . Acute renal failure superimposed on stage 3 chronic kidney disease (HCC) 12/17/2016  . Hyperchloremic acidosis 12/17/2016  . Hypomagnesemia 12/17/2016  . Pancytopenia (HCC) 12/17/2016  . Hyperlipidemia associated with type 2 diabetes mellitus (HCC) 12/17/2016  . Abnormal LFTs 11/28/2016  . Paroxysmal atrial fibrillation (HCC) 11/28/2016  . RLS (restless legs syndrome) 08/03/2016  . Restrictive lung disease 06/02/2016  . AKI (acute kidney injury) (HCC) 02/21/2016  . Uncontrolled type 2 diabetes mellitus with diabetic polyneuropathy, with long-term current use of insulin (HCC) 08/12/2015  . 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 Benign hypertension with chronic kidney disease, stage III (HCC) 06/14/2015  . Diabetes mellitus with stage 3 chronic kidney disease (HCC) 06/14/2015  . Vitamin D deficiency 06/14/2015  . CAD (coronary artery disease) 02/22/2015  . Chronic kidney disease 02/22/2015  . Obstructive sleep apnea 02/22/2015  . Old myocardial infarction  02/22/2015  . Right posterior capsular opacification 02/04/2015  . Pseudogout 12/25/2013  . Status post intraocular lens implant 05/15/2013  . Adjustment disorder with depressed mood 03/03/2013  . Acquired spondylolisthesis 09/14/2012  . Depressive disorder 09/14/2012  . Displacement of lumbar intervertebral disc 09/14/2012  . Morbid obesity (HCC) 09/14/2012  . Tear of medial cartilage or meniscus of knee, current 09/14/2012  . Diabetic macular edema (HCC) 09/12/2012  . 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 associated with type 2 diabetes mellitus (HCC) 08/01/2012    CMP     Component Value  Date/Time   NA 129 (A) 10/15/2017   NA 135 09/26/2017   K 4.4 10/15/2017   K 5.2 09/26/2017   CL 109 10/01/2017 1439   CO2 26 10/01/2017 1439   GLUCOSE 384 (H) 10/01/2017 1439   BUN 56 (A) 10/15/2017   CREATININE 2.2 (A) 10/15/2017   CREATININE 1.92 (H) 10/01/2017 1439   CREATININE 2.17 09/26/2017   CALCIUM 8.6 (L) 10/01/2017 1439   CALCIUM 8.8 09/26/2017   PROT 6.4 (L) 09/29/2017 1839   ALBUMIN 2.7 (L) 09/29/2017 1839   AST 54 (H) 09/29/2017 1839   ALT 48 09/29/2017 1839   ALKPHOS 198 (H) 09/29/2017 1839   BILITOT 0.5 09/29/2017 1839   GFRNONAA 32 (L) 10/01/2017 1439   GFRNONAA 27.98 09/26/2017   GFRAA 37 (L) 10/01/2017 1439   Recent Labs    09/30/17 0403 10/01/17 0434 10/01/17 1439 10/03/17 10/08/17 10/15/17  NA 143 141 143 147 143 129*  K 5.3* 5.4* 5.0 4.6 5.8* 4.4  CL 108 109 109  --   --   --   CO2 26 25 26   --   --   --   GLUCOSE 194* 326* 384*  --   --   --   BUN 52* 54* 50* 55* 86* 56*  CREATININE 1.92* 2.01* 1.92* 2.1* 2.3* 2.2*  CALCIUM 8.9 8.9 8.6*  --   --   --    Recent Labs    11/27/16 2035 09/08/17 09/29/17 1839  AST 108* 36 54*  ALT 113* 21 48  ALKPHOS 254* 170* 198*  BILITOT 4.5*  --  0.5  PROT 6.5  --  6.4*  ALBUMIN 3.4*  --  2.7*   Recent Labs    11/27/16 2035  09/26/17 09/29/17 1839 09/30/17 0403 10/03/17  11/15/17  WBC 3.7*   < > 5.6 6.4 7.8 6.4 4.3  NEUTROABS 2.8  --   --  4.9  --   --   --   HGB 9.7*   < > 8.8 8.1* 9.5* 9.0* 8.0*  HCT 29.1*   < > 26.9 26.1* 30.3* 28* 24*  MCV 87.1  --  85.9 89.4 88.3  --   --   PLT 95*   < >  --  160 151 132* 144*   < > = values in this interval not displayed.   No results for input(s): CHOL, LDLCALC, TRIG in the last 8760 hours.  Invalid input(s): HCL No results found for: MICROALBUR No results found for: TSH No results found for: HGBA1C No results found for: CHOL, HDL, LDLCALC, LDLDIRECT, TRIG, CHOLHDL  Significant Diagnostic Results in last 30 days:  No results found.  Assessment and Plan  Hyponatremia-sodium of 117; patient had been getting increased water flushes for increased BUN and creatinine but these had been slow down over the last couple days; will start sodium 1300 g 3 times daily prior to and recheck sodium again tomorrow    Merrilee SeashoreAnne Lanyia Jewel, MD

## 2017-11-20 NOTE — Assessment & Plan Note (Addendum)
Chronic and stable; continue vitamin D replacement at 2000 units per tube daily

## 2017-12-05 ENCOUNTER — Non-Acute Institutional Stay (SKILLED_NURSING_FACILITY): Payer: Medicare Other | Admitting: Internal Medicine

## 2017-12-05 DIAGNOSIS — R001 Bradycardia, unspecified: Secondary | ICD-10-CM

## 2017-12-05 DIAGNOSIS — T50905A Adverse effect of unspecified drugs, medicaments and biological substances, initial encounter: Secondary | ICD-10-CM

## 2017-12-06 ENCOUNTER — Encounter: Payer: Self-pay | Admitting: Internal Medicine

## 2017-12-06 NOTE — Progress Notes (Signed)
:    Location:  [facility] Room Number: 321P Place of Service:  SNF (31)  Eric Huerta D. Lyn Hollingshead, MD  Patient Care Team: Andreas Blower., MD as PCP - General (Internal Medicine)  Extended Emergency Contact Information Primary Emergency Contact: Coleson,Jean Address: [address]           [place], [place] [address] [place] Home Phone: [phone] Relation: Spouse     Allergies: Norvasc [amlodipine besylate]; Penicillins; Pregabalin; and Fentanyl  Chief Complaint  Patient presents with  . Acute Visit    Decreased heart rate    HPI: Patient is 77 y.o. male who is being seen for an episode of heart rate of 40.  There were no symptoms other than the pallor, blood pressure remained normal.  Patient is partially vegetative and cannot give a history.  Patient is on Coreg 25 mg twice daily  Past Medical History:  Diagnosis Date  . Abnormal LFTs 11/28/2016  . Acquired spondylolisthesis 09/14/2012  . Acute cholecystitis due to biliary calculus   . Adjustment disorder with depressed mood 03/03/2013  . AKI (acute kidney injury) (HCC) 02/21/2016  . 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 Arthritis   . Back pain   . Benign hypertension with chronic kidney disease, stage III (HCC) 06/14/2015  . CAD (coronary artery disease) 02/22/2015  . Choledocholithiasis   . Chronic kidney disease 02/22/2015  . Chronic kidney disease   . Depressive disorder 09/14/2012  . Diabetes mellitus with stage 3 chronic kidney disease (HCC) 06/14/2015  . Diabetes mellitus without complication (HCC)   . Diabetic macular edema (HCC) 09/12/2012  . 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 Diabetic retinopathy (HCC)   . Disc degeneration, lumbar   . Displacement of lumbar intervertebral disc 09/14/2012  . Elbow pain   . GERD (gastroesophageal reflux disease)    . High cholesterol   . Hypercholesteremia   . Hypertension   . Morbid obesity (HCC) 09/14/2012  . Myocardial infarction (HCC)   . Neuropathy   . Obesity   . Obstructive sleep apnea 02/22/2015  . Old myocardial infarction 02/22/2015  . Paroxysmal atrial fibrillation (HCC) 11/28/2016  . Pseudogout 12/25/2013   Overview:  Based on Right olecranon bursa synovial fluid analysis  . Renal disorder   . Restrictive lung disease 06/02/2016  . Right posterior capsular opacification 02/04/2015  . RLS (restless legs syndrome) 08/03/2016  . Sleep apnea   . Status post intraocular lens implant 05/15/2013  . Tear of medial cartilage or meniscus of knee, current 09/14/2012  . Uncontrolled type 2 diabetes mellitus with diabetic polyneuropathy, with long-term current use of insulin (HCC) 08/12/2015  . Vitamin D deficiency 06/14/2015    Past Surgical History:  Procedure Laterality Date  . BLEPHAROPLASTY  08/23/2017   Upper lids  . CARDIAC SURGERY  2000   stents placed in heart  . CATARACT EXTRACTION, BILATERAL    . GALLBLADDER SURGERY  12/2016  . GASTROSTOMY TUBE PLACEMENT  09/18/2017  . heart stent    . IR REPLC GASTRO/COLONIC TUBE PERCUT W/FLUORO  09/30/2017  . KNEE SURGERY Left 09/14/2012  . LAPAROSCOPIC CHOLECYSTECTOMY  12/01/2016  . LAPAROSCOPIC CHOLECYSTECTOMY  12/01/2016  . LUMBAR DISC SURGERY    . LUMBAR DISC SURGERY    . YAG CAPSULOTOMY Left 05/29/2017  . YAG CAPSULOTOMY Right 06/13/2017    Allergies as  of 12/05/2017      Reactions   Norvasc [amlodipine Besylate]    unknown   Penicillins    unknown   Pregabalin Other (See Comments)   Disoriented and weakness in the legs   Fentanyl Rash   Pt reports skin burn with generic fentanyl - he can use BRAND NAME duragesic without issue       Medication List        Accurate as of 12/05/17 11:59 PM. Always use your most recent med list.          acetaminophen 160 MG/5ML suspension Commonly known as:  TYLENOL Place 650 mg into feeding  tube every 6 (six) hours as needed for fever.   albuterol (2.5 MG/3ML) 0.083% nebulizer solution Commonly known as:  PROVENTIL Take 2.5 mg by nebulization every 6 (six) hours as needed for wheezing or shortness of breath.   albuterol 108 (90 Base) MCG/ACT inhaler Commonly known as:  PROVENTIL HFA;VENTOLIN HFA Inhale into the lungs every 6 (six) hours as needed for wheezing or shortness of breath.   aspirin 81 MG chewable tablet Place 81 mg into feeding tube daily.   atorvastatin 40 MG tablet Commonly known as:  LIPITOR Place 40 mg into feeding tube at bedtime.   brimonidine 0.2 % ophthalmic solution Commonly known as:  ALPHAGAN Place 1 drop into both eyes 3 (three) times daily.   carvedilol 6.25 MG tablet Commonly known as:  COREG Place 6.25 mg into feeding tube 2 (two) times daily with a meal.   Cholecalciferol 2000 units Chew Place 2,000 Units into feeding tube daily.   docusate 50 MG/5ML liquid Commonly known as:  COLACE Take by mouth 2 (two) times daily.   erythromycin ophthalmic ointment Place 1 application into the right eye 4 (four) times daily.   folic acid 1 MG tablet Commonly known as:  FOLVITE Place 1 mg into feeding tube daily.   HUMALOG KWIKPEN [place] Inject 1-15 Units into the skin every 6 (six) hours.   hydrALAZINE 50 MG tablet Commonly known as:  APRESOLINE Place 50 mg into feeding tube every 8 (eight) hours.   hydrALAZINE 100 MG tablet Commonly known as:  APRESOLINE Take 100 mg by mouth. GIVE 1 TABLET VIA TUBE EVERY 8 HOURS   lansoprazole 30 MG capsule Commonly known as:  PREVACID Take 30 mg by mouth. TAKE 1 CAPSULE VIA TUBE ONCE DAILY FOR GERD (DO NOT CRUSH)   LANTUS 100 UNIT/ML injection Generic drug:  insulin glargine Inject 24 Units into the skin 2 (two) times daily.   LANTUS SOLOSTAR 100 UNIT/ML Solostar Pen Generic drug:  Insulin Glargine Inject into the skin daily. INJECT 10U SUBCUTANEOUSLY TWICE A DAY   lidocaine 5 % Commonly known  as:  LIDODERM Place 1-3 patches onto the skin daily as needed (pain). Remove & Discard patch within 12 hours or as directed by MD   nitroGLYCERIN 0.4 MG SL tablet Commonly known as:  NITROSTAT Place 0.4 mg under the tongue. Place one SL tablet under the tongue every 5 minutes as needed for chest pain for 3 doses   pantoprazole sodium 40 mg/20 mL Pack Commonly known as:  PROTONIX Place 40 mg into feeding tube daily.   polyvinyl alcohol 1.4 % ophthalmic solution Commonly known as:  LIQUIFILM TEARS Place 1 drop into both eyes as needed for dry eyes.   SUPLENA/CARB STEADY PO Take by mouth. By PEG tube route. 3345ml/hr via peg x 24 hrs   thiamine 100 MG tablet Place 100  mg into feeding tube daily.       No orders of the defined types were placed in this encounter.   Immunization History  Administered Date(s) Administered  . Influenza, High Dose Seasonal PF 01/05/2016  . Pneumococcal Conjugate-13 06/06/2013  . Pneumococcal Polysaccharide-23 02/13/2007  . Tdap 03/07/2010  . Zoster 09/17/2007    Social History   Tobacco Use  . Smoking status: Former Smoker    Packs/day: 1.00    Years: 20.00    Pack years: 20.00    Types: Cigarettes    Last attempt to quit: 09/28/2016    Years since quitting: 1.1  . Smokeless tobacco: Never Used  Substance Use Topics  . Alcohol use: No    Family history is   Family History  Problem Relation Age of Onset  . Diabetes Mother   . Hypertension Mother   . Hypertension Father   . Stroke Father   . Cancer Sister        bowel  . Diabetes Maternal Grandmother       Review of Systems unable to obtain secondary to baseline mental status; nursing- as per history of present illness     Vitals:   12/06/17 1217  BP: (!) 120/50  Pulse: (!) 41  Resp: 19  Temp: (!) 96.6 F (35.9 C)    SpO2 Readings from Last 1 Encounters:  10/01/17 99%   Body mass index is 35.4 kg/m.     Physical Exam  GENERAL APPEARANCE: Alert,  non-conversant,  No acute distress.  SKIN: No diaphoresis rash HEAD: Normocephalic, atraumatic  EYES: Conjunctiva/lids clear. Pupils round, reactive. EOMs intact.  EARS: External exam WNL, canals clear. Hearing grossly normal.  NOSE: No deformity or discharge.  MOUTH/THROAT: Lips w/o lesions  RESPIRATORY: Breathing is even, unlabored. Lung sounds are clear   CARDIOVASCULAR: Heart regular, slow in the 50s no murmurs, rubs or gallops. No peripheral edema.   GASTROINTESTINAL: Abdomen is soft, non-tender, not distended w/ normal bowel sounds. GENITOURINARY: Bladder non tender, not distended  MUSCULOSKELETAL: No abnormal joints or musculature NEUROLOGIC: Semi-vegetative PSYCHIATRIC: Not applicable  , no behavioral issues  Patient Active Problem List   Diagnosis Date Noted  . Hyponatremia 11/17/2017  . UTI due to extended-spectrum beta lactamase (ESBL) producing Escherichia coli 11/17/2017  . Hematuria 11/17/2017  . Encephalopathy chronic 10/04/2017  . Hyperkalemia 10/04/2017  . Pancreatic mass 10/04/2017  . Chronic diastolic (congestive) heart failure (HCC) 10/02/2017  . Corneal abrasion, right 10/02/2017  . GERD (gastroesophageal reflux disease) 10/02/2017  . Dysphasia 10/01/2017  . Malfunction of percutaneous endoscopic gastrostomy (PEG) tube (HCC) 09/29/2017  . Acute on chronic respiratory failure with hypoxia and hypercapnia (HCC) 09/29/2017  . Acute metabolic encephalopathy 09/29/2017  . Acute cholecystitis due to biliary calculus 12/17/2016  . Acute urinary retention 12/17/2016  . Acute renal failure superimposed on stage 3 chronic kidney disease (HCC) 12/17/2016  . Hyperchloremic acidosis 12/17/2016  . Hypomagnesemia 12/17/2016  . Pancytopenia (HCC) 12/17/2016  . Hyperlipidemia associated with type 2 diabetes mellitus (HCC) 12/17/2016  . Abnormal LFTs 11/28/2016  . Paroxysmal atrial fibrillation (HCC) 11/28/2016  . RLS (restless legs syndrome) 08/03/2016  . Restrictive lung  disease 06/02/2016  . AKI (acute kidney injury) (HCC) 02/21/2016  . Uncontrolled type 2 diabetes mellitus with diabetic polyneuropathy, with long-term current use of insulin (HCC) 08/12/2015  . 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 Benign hypertension with chronic kidney disease, stage III (HCC) 06/14/2015  . Diabetes mellitus with stage  3 chronic kidney disease (HCC) 06/14/2015  . Vitamin D deficiency 06/14/2015  . CAD (coronary artery disease) 02/22/2015  . Chronic kidney disease 02/22/2015  . Obstructive sleep apnea 02/22/2015  . Old myocardial infarction 02/22/2015  . Right posterior capsular opacification 02/04/2015  . Pseudogout 12/25/2013  . Status post intraocular lens implant 05/15/2013  . Adjustment disorder with depressed mood 03/03/2013  . Acquired spondylolisthesis 09/14/2012  . Depressive disorder 09/14/2012  . Displacement of lumbar intervertebral disc 09/14/2012  . Morbid obesity (HCC) 09/14/2012  . Tear of medial cartilage or meniscus of knee, current 09/14/2012  . Diabetic macular edema (HCC) 09/12/2012  . 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 associated with type 2 diabetes mellitus (HCC) 08/01/2012      Labs reviewed: Basic Metabolic Panel:    Component Value Date/Time   NA 129 (A) 10/15/2017   NA 135 09/26/2017   K 4.4 10/15/2017   K 5.2 09/26/2017   CL 109 10/01/2017 1439   CO2 26 10/01/2017 1439   GLUCOSE 384 (H) 10/01/2017 1439   BUN 56 (A) 10/15/2017   CREATININE 2.2 (A) 10/15/2017   CREATININE 1.92 (H) 10/01/2017 1439   CREATININE 2.17 09/26/2017   CALCIUM 8.6 (L) 10/01/2017 1439   CALCIUM 8.8 09/26/2017   PROT 6.4 (L) 09/29/2017 1839   ALBUMIN 2.7 (L) 09/29/2017 1839   AST 54 (H) 09/29/2017 1839   ALT 48 09/29/2017 1839   ALKPHOS 198 (H) 09/29/2017 1839   BILITOT 0.5 09/29/2017 1839   GFRNONAA 32 (L) 10/01/2017 1439   GFRNONAA 27.98 09/26/2017   GFRAA 37 (L) 10/01/2017 1439     Recent Labs    09/30/17 0403 10/01/17 0434 10/01/17 1439 10/03/17 10/08/17 10/15/17  NA 143 141 143 147 143 129*  K 5.3* 5.4* 5.0 4.6 5.8* 4.4  CL 108 109 109  --   --   --   CO2 26 25 26   --   --   --   GLUCOSE 194* 326* 384*  --   --   --   BUN 52* 54* 50* 55* 86* 56*  CREATININE 1.92* 2.01* 1.92* 2.1* 2.3* 2.2*  CALCIUM 8.9 8.9 8.6*  --   --   --    Liver Function Tests: Recent Labs    09/08/17 09/29/17 1839  AST 36 54*  ALT 21 48  ALKPHOS 170* 198*  BILITOT  --  0.5  PROT  --  6.4*  ALBUMIN  --  2.7*   No results for input(s): LIPASE, AMYLASE in the last 8760 hours. Recent Labs    09/30/17 0003  AMMONIA 23   CBC: Recent Labs    09/26/17 09/29/17 1839 09/30/17 0403 10/03/17 11/15/17  WBC 5.6 6.4 7.8 6.4 4.3  NEUTROABS  --  4.9  --   --   --   HGB 8.8 8.1* 9.5* 9.0* 8.0*  HCT 26.9 26.1* 30.3* 28* 24*  MCV 85.9 89.4 88.3  --   --   PLT  --  160 151 132* 144*   Lipid No results for input(s): CHOL, HDL, LDLCALC, TRIG in the last 8760 hours.  Cardiac Enzymes: No results for input(s): CKTOTAL, CKMB, CKMBINDEX, TROPONINI in the last 8760 hours. BNP: No results for input(s): BNP in the last 8760 hours. No results found for: MICROALBUR No results found for: HGBA1C No results found for: TSH No results found for: VITAMINB12 No results found for: FOLATE No results found for: IRON, TIBC, FERRITIN  Imaging and Procedures  obtained prior to SNF admission: Ct Abdomen Pelvis Wo Contrast  Result Date: 09/29/2017 CLINICAL DATA:  reportedly the G-Tube came out as staff were doing peri care on pt. He is agitated something EMS states that's baseline secondary to hx of dementia. EXAM: CT ABDOMEN AND PELVIS WITHOUT CONTRAST TECHNIQUE: Multidetector CT imaging of the abdomen and pelvis was performed following the standard protocol without IV contrast. COMPARISON:  None. FINDINGS: Lower chest: Heart is mildly enlarged. Mild lung base scarring and/or subsegmental atelectasis.  No acute findings. Is Hepatobiliary: Liver normal in overall size. No liver mass or focal lesion. There are morphologic changes with central volume loss and relative enlargement of the lateral segment left lobe that raises possibility of cirrhosis. Gallbladder surgically absent. No bile duct dilation. Pancreas: 16 mm hypoattenuating mass arises from pancreatic tail. This is not clearly cystic and incompletely characterized on this unenhanced study. No other pancreatic masses. No pancreatic inflammation. Spleen: Spleen borderline enlarged measuring 12 cm in long axis. No splenic masses or focal lesions. Adrenals/Urinary Tract: No adrenal masses. Right kidney is inferiorly located. It demonstrates a lobulated contour. There is no defined right renal mass. No collecting system stone. No hydronephrosis. Left kidney is normal in size and position. No left renal mass, stone or hydronephrosis. Ureters are normal in course and in caliber. Bladder is distended.  No bladder wall thickening, masses or stones. Stomach/Bowel: Anterior aspect of the stomach is retracted against the anterior peritoneal cavity adjacent to a gastrostomy tract. There is no extraluminal air or fluid. Stomach otherwise unremarkable. Bowel is normal in caliber. No bowel wall thickening or inflammatory changes. Vascular/Lymphatic: There several prominent gastrohepatic ligament lymph nodes, none pathologically enlarged by size criteria. No enlarged lymph nodes. Dense atherosclerotic calcifications are noted along the abdominal aorta and its branch vessels. No aneurysm. Reproductive: Unremarkable. Other: No fluid collection is seen along the gastrostomy tube tract. No abdominal wall hernia. No ascites. No evidence of an abdominal abscess. Musculoskeletal: L5 is a transitional lumbosacral vertebra. 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 No acute fracture. No osteoblastic or osteolytic lesions.  IMPRESSION: 1. No acute findings within the abdomen or pelvis. 2. The gastrostomy tract is visualized. There is no free intraperitoneal air, ascites or fluid collection to suggest a complication related to the gastrostomy tube. 3. 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 Borderline splenomegaly. 4. Small hypoattenuating mass measuring 1.6 cm arises from the pancreatic tail. This could reflect a neoplasm. It could be cystic but is not well-defined on this unenhanced study. Recommend follow-up CT in 6 months, with contrast if the patient can tolerate contrast. 5. Dense aortic atherosclerosis. 6. Ptotic right kidney. No hydronephrosis or acute renal abnormality. 7. Distended bladder. 8. 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 Electronically Signed   By: Amie Portland M.D.   On: 09/29/2017 18:39   Ir Replc Gastro/colonic Tube Percut W/fluoro  Result Date: 09/30/2017 INDICATION: History of end-stage dementia with recently placed gastrostomy tube, inadvertently removed. Request made for fluoroscopic guided gastrostomy tube placement. Gastrostomy tube placed at an outside institution. EXAM: FLUOROSCOPIC GUIDED REPLACEMENT OF GASTROSTOMY TUBE COMPARISON:  CT abdomen and pelvis - 09/29/2017 MEDICATIONS: None. CONTRAST:  15 cc Isovue-300 - administered into the gastric lumen FLUOROSCOPY TIME:  2 minutes 6 seconds (50 mGy) COMPLICATIONS: None immediate. PROCEDURE: The site of the prior gastrostomy tube was cannulated with a Christmas tree adapter with contrast injection demonstrating atretic  though patent gastrostomy track to the level of the stomach. A Kumpe catheter was advanced through the gastrostomy track to the level of the stomach and distal esophagus. Contrast injection confirmed appropriate positioning. Next, over a short Amplatz wire, a new, 18 French balloon inflatable gastrostomy tube was inserted through the track. The balloon was inflated and disc was  cinched. Contrast was injected and several spot fluoroscopic images were obtained in various obliquities to confirm appropriate intraluminal positioning. A dressing was placed. The patient tolerated the procedure well without immediate postprocedural complication. IMPRESSION: Successful fluoroscopic guided replacement of a new 18-French gastrostomy tube. The new gastrostomy tube is ready for immediate use. Electronically Signed   By: Simonne Come M.D.   On: 09/30/2017 11:30   Dg Chest Port 1 View  Result Date: 09/29/2017 CLINICAL DATA:  Dysphagia. EXAM: PORTABLE CHEST 1 VIEW COMPARISON:  None. FINDINGS: The patient is rotated. Heart size is likely accentuated by technique and rotation, as it is normal on CT from same day. Atherosclerotic calcification of the aortic arch. Normal pulmonary vascularity. No focal consolidation, pleural effusion, or pneumothorax. No acute osseous abnormality. IMPRESSION: 1. No active disease. 2.  Aortic atherosclerosis (ICD10-I70.0). Electronically Signed   By: Obie Dredge M.D.   On: 09/29/2017 21:01     Not all labs, radiology exams or other studies done during hospitalization come through on my EPIC note; however they are reviewed by me.    Assessment and Plan  Bradycardia- I looked through days and days of patient's heart rate; since admission and has been steadily dropping, in the past 3 weeks it has been in the 50s; Coreg needs to be tapered, starting today 12-1/2 twice daily x1 week then 6.25 twice daily x1 week then off; continue to monitor heart rate, if he is doing okay at 6.25 twice daily we may leave him at 6.25 twice daily   Time spent greater than 35 minutes Jacolyn Joaquin D. Lyn Hollingshead, MD

## 2017-12-06 NOTE — Progress Notes (Signed)
Opened in error

## 2017-12-08 ENCOUNTER — Encounter: Payer: Self-pay | Admitting: Internal Medicine

## 2017-12-08 NOTE — Progress Notes (Signed)
This encounter was created in error - please disregard.

## 2018-01-08 DEATH — deceased

## 2019-11-12 IMAGING — CT CT ABD-PELV W/O CM
2 of 4 series · 15 of 46 positions shown, 17 images · non-contrast
Comparison: None.

CLINICAL DATA: reportedly the G-Tube came out as staff were doing
peri care on pt. He is agitated something EMS states that's baseline
secondary to hx of dementia.

EXAM:
CT ABDOMEN AND PELVIS WITHOUT CONTRAST
TECHNIQUE: Multidetector CT imaging of the abdomen and pelvis was performed
following the standard protocol without IV contrast.

[Series 2: axial st · axial · 0.95mm/px · z∈[+1078,+1498]mm · 12 of 96 slices shown, 14 images]
[im 6/96  soft-tissue]
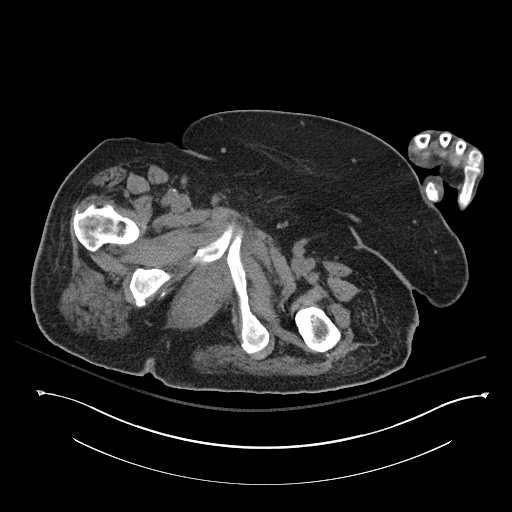
[im 6/96  bone]
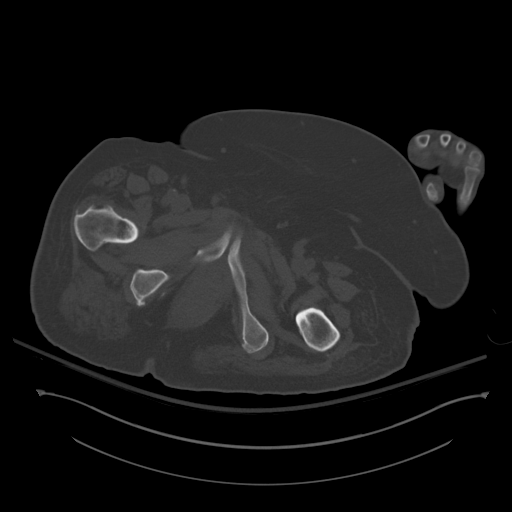
[im 17/96  soft-tissue]
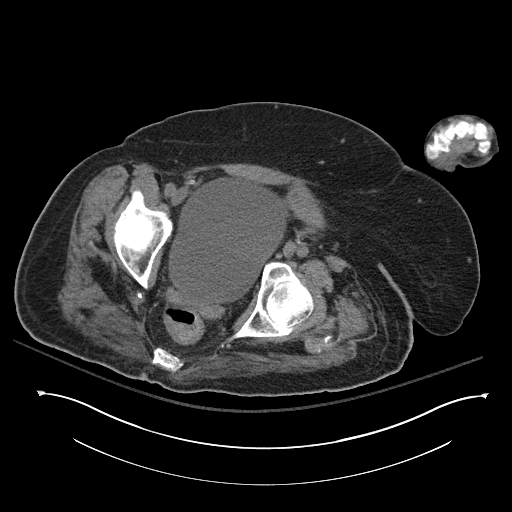
[im 23/96  soft-tissue]
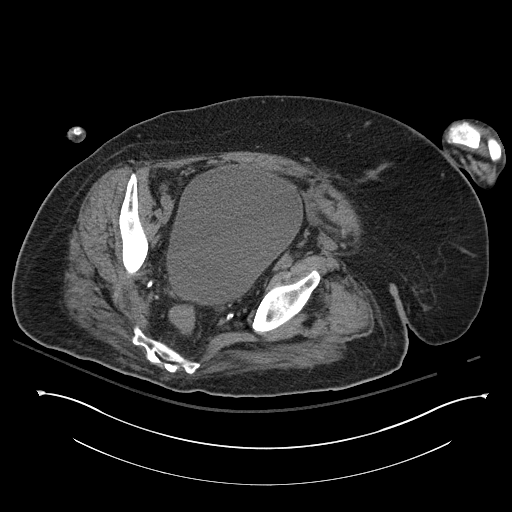
[im 28/96  soft-tissue]
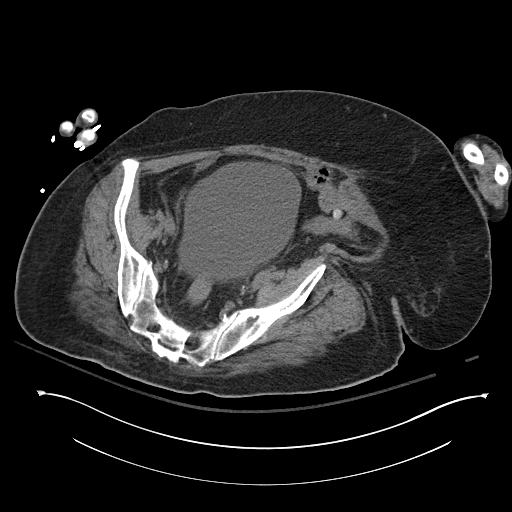
[im 40/96  soft-tissue]
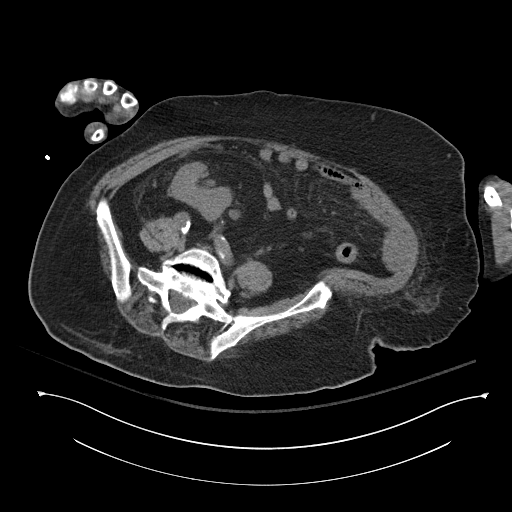
[im 45/96  soft-tissue]
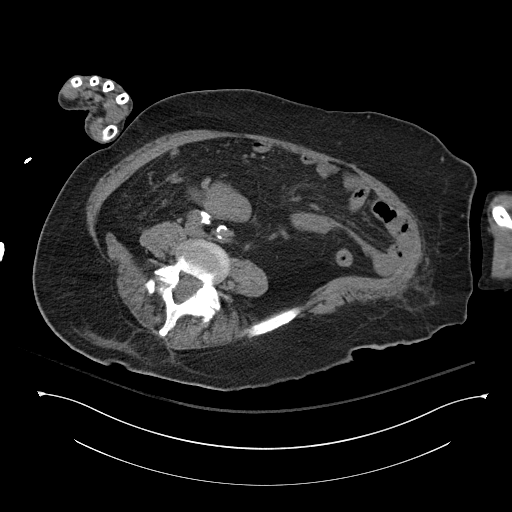
[im 51/96  soft-tissue]
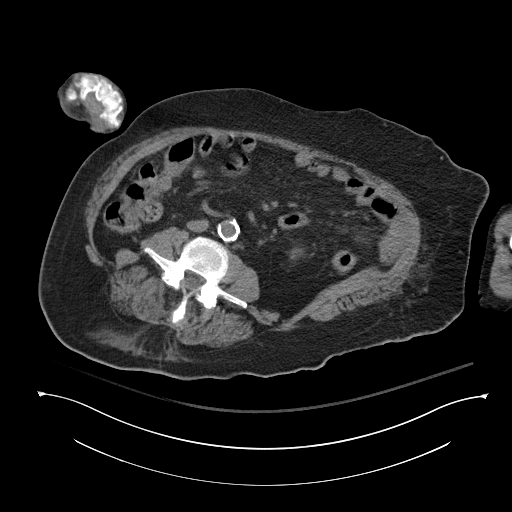
[im 62/96  soft-tissue]
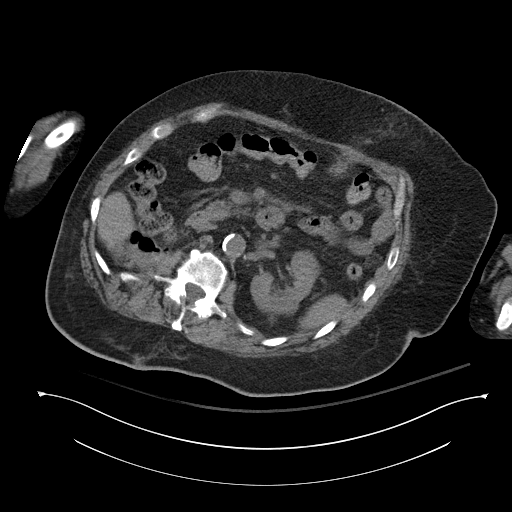
[im 68/96  soft-tissue]
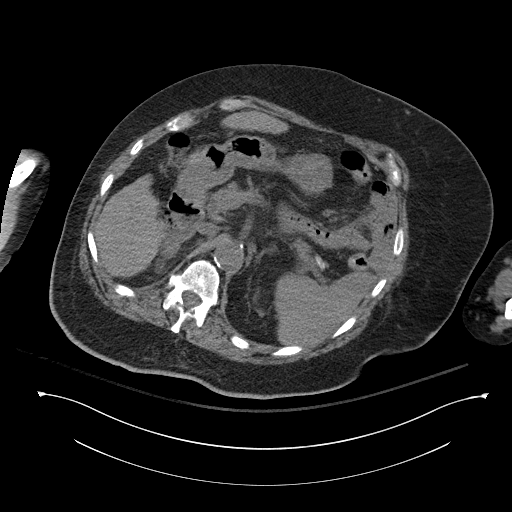
[im 68/96  bone]
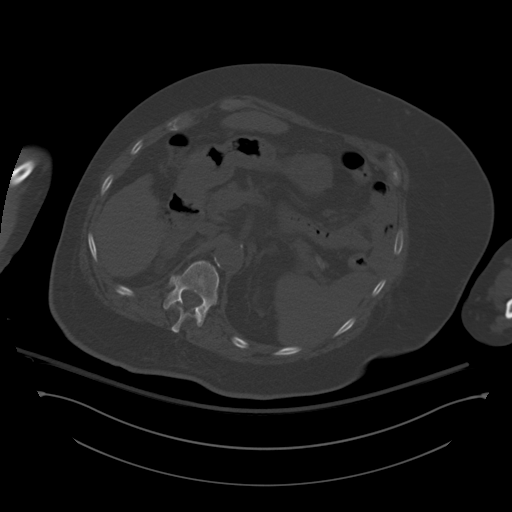
[im 73/96  soft-tissue]
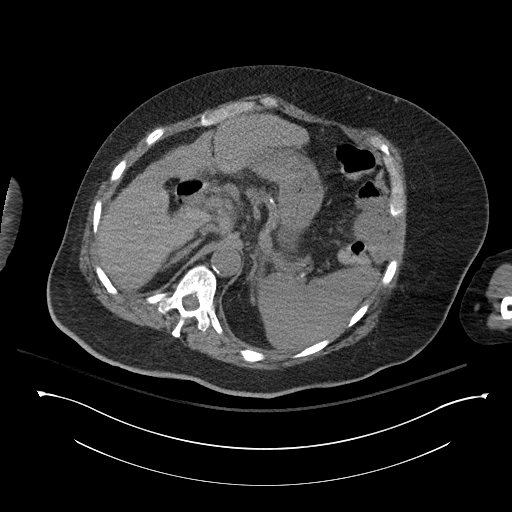
[im 84/96  soft-tissue]
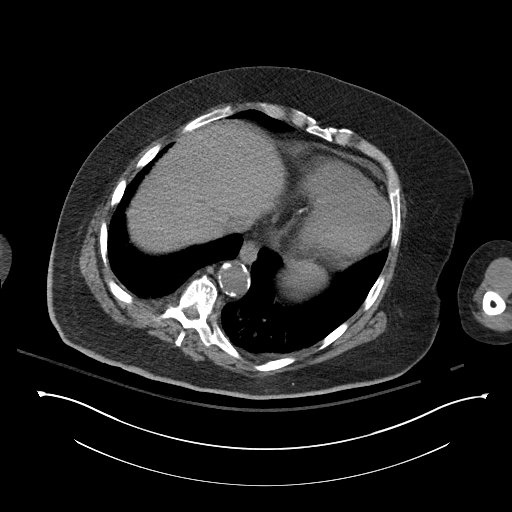
[im 90/96  soft-tissue]
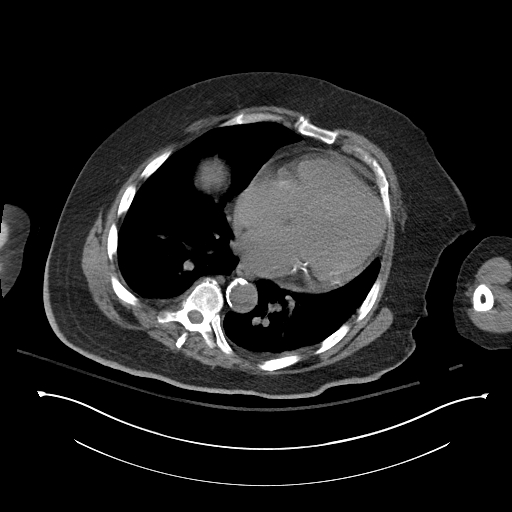

[Series 4: coronal st · coronal · 0.98mm/px · 3 of 115 slices shown]
[im 39/115  soft-tissue]
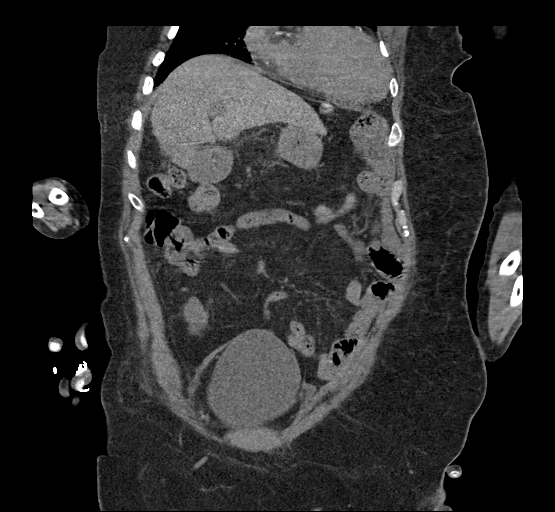
[im 51/115  soft-tissue]
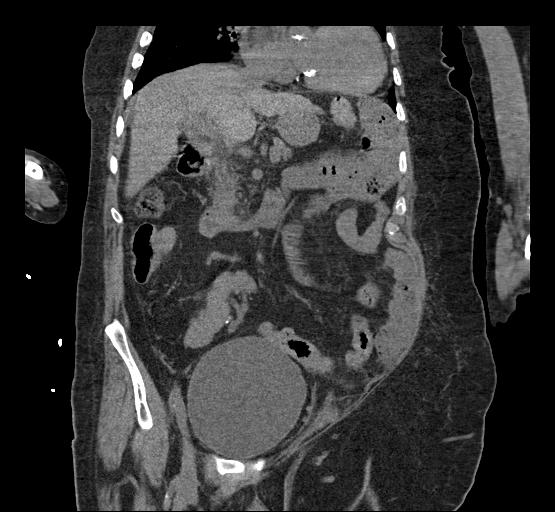
[im 64/115  soft-tissue]
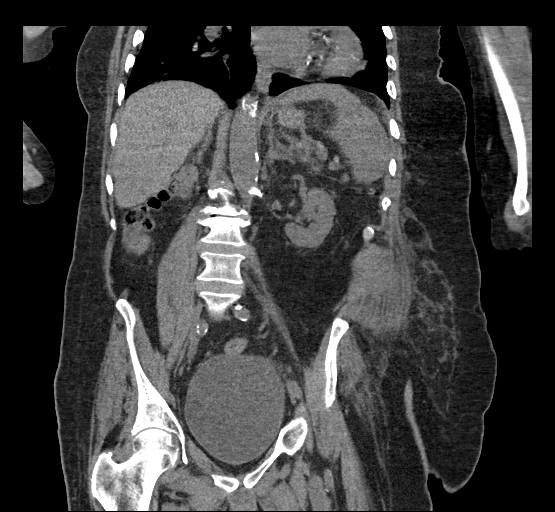

[15 of 46 positions shown; findings below may reference images not displayed]

FINDINGS: Lower chest: Heart is mildly enlarged. Mild lung base scarring
and/or subsegmental atelectasis. No acute findings. Is

Hepatobiliary: Liver normal in overall size. No liver mass or focal
lesion. There are morphologic changes with central volume loss and
relative enlargement of the lateral segment left lobe that raises
possibility of cirrhosis. Gallbladder surgically absent. No bile
duct dilation.

Pancreas: 16 mm hypoattenuating mass arises from pancreatic tail.
This is not clearly cystic and incompletely characterized on this
unenhanced study. No other pancreatic masses. No pancreatic
inflammation.

Spleen: Spleen borderline enlarged measuring 12 cm in long axis. No
splenic masses or focal lesions.

Adrenals/Urinary Tract: No adrenal masses.

Right kidney is inferiorly located. It demonstrates a lobulated
contour. There is no defined right renal mass. No collecting system
stone. No hydronephrosis. Left kidney is normal in size and
position. No left renal mass, stone or hydronephrosis. Ureters are
normal in course and in caliber.

Bladder is distended.  No bladder wall thickening, masses or stones.

Stomach/Bowel: Anterior aspect of the stomach is retracted against
the anterior peritoneal cavity adjacent to a gastrostomy tract.
There is no extraluminal air or fluid. Stomach otherwise
unremarkable.

Bowel is normal in caliber. No bowel wall thickening or inflammatory
changes.

Vascular/Lymphatic: There several prominent gastrohepatic ligament
lymph nodes, none pathologically enlarged by size criteria. No
enlarged lymph nodes. Dense atherosclerotic calcifications are noted
along the abdominal aorta and its branch vessels. No aneurysm.

Reproductive: Unremarkable.

Other: No fluid collection is seen along the gastrostomy tube tract.
No abdominal wall hernia. No ascites. No evidence of an abdominal
abscess.

Musculoskeletal: L5 is a transitional lumbosacral vertebra. There
are chronic bilateral pars defects at L4-L5 with a grade 1 to grade
2 anterolisthesis. No acute fracture. No osteoblastic or osteolytic
lesions.
IMPRESSION: 1. No acute findings within the abdomen or pelvis.
2. The gastrostomy tract is visualized. There is no free
intraperitoneal air, ascites or fluid collection to suggest a
complication related to the gastrostomy tube.
3. Morphologic changes of the liver suggests cirrhosis. Borderline
splenomegaly.
4. Small hypoattenuating mass measuring 1.6 cm arises from the
pancreatic tail. This could reflect a neoplasm. It could be cystic
but is not well-defined on this unenhanced study. Recommend
follow-up CT in 6 months, with contrast if the patient can tolerate
contrast.
5. Dense aortic atherosclerosis.
6. Ptotic right kidney. No hydronephrosis or acute renal
abnormality.
7. Distended bladder.
8. Chronic bilateral pars defects at L4-L5 with a grade 1 to grade 2
anterolisthesis.

## 2019-11-13 IMAGING — XA IR REPLACE G-TUBE/COLONIC TUBE
1 series · 3 of 3 positions shown · non-contrast
Comparison: CT abdomen and pelvis - 09/29/2017

INDICATION: History of end-stage dementia with recently placed gastrostomy tube,
inadvertently removed.

Request made for fluoroscopic guided gastrostomy tube placement.
Gastrostomy tube placed at an outside institution.
EXAM:
FLUOROSCOPIC GUIDED REPLACEMENT OF GASTROSTOMY TUBE

[Series 300: tube placements · 3 of 3 slices shown]
[im 1/3]
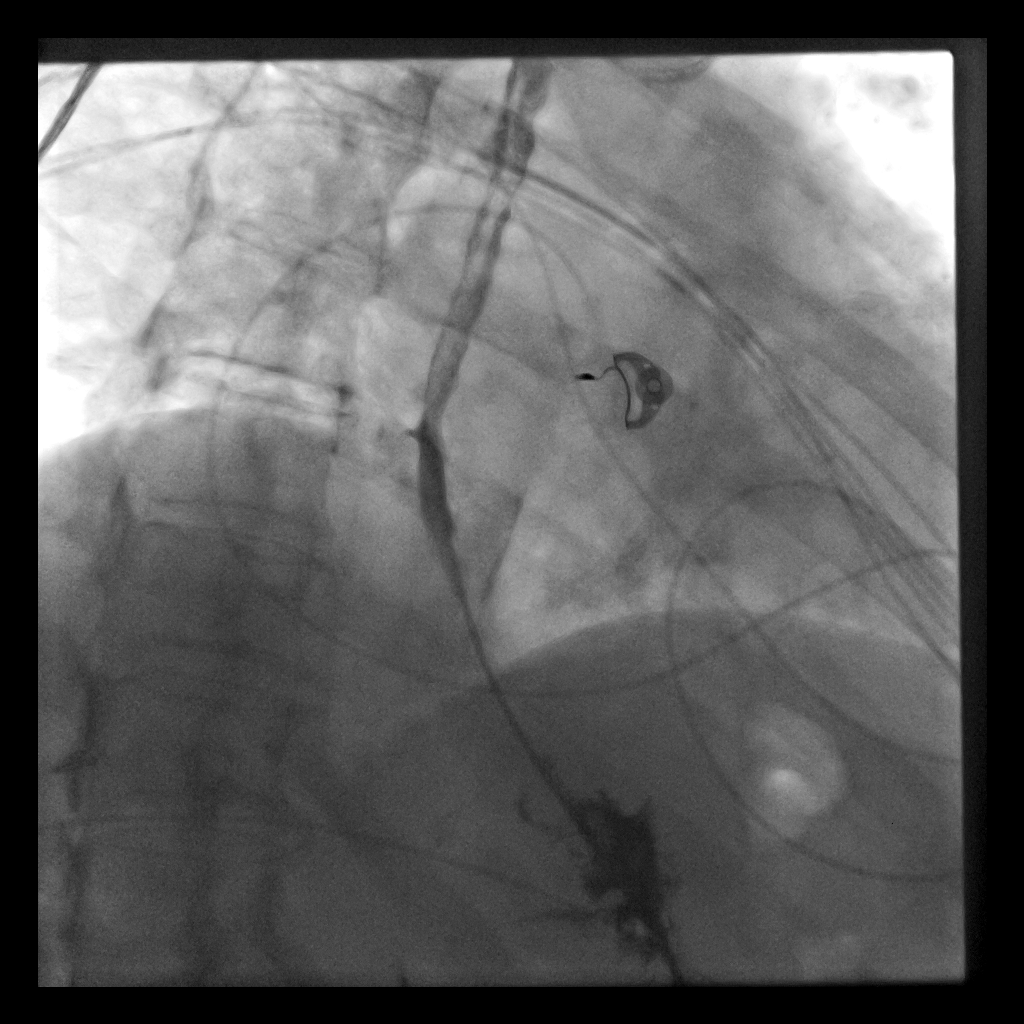
[im 2/3]
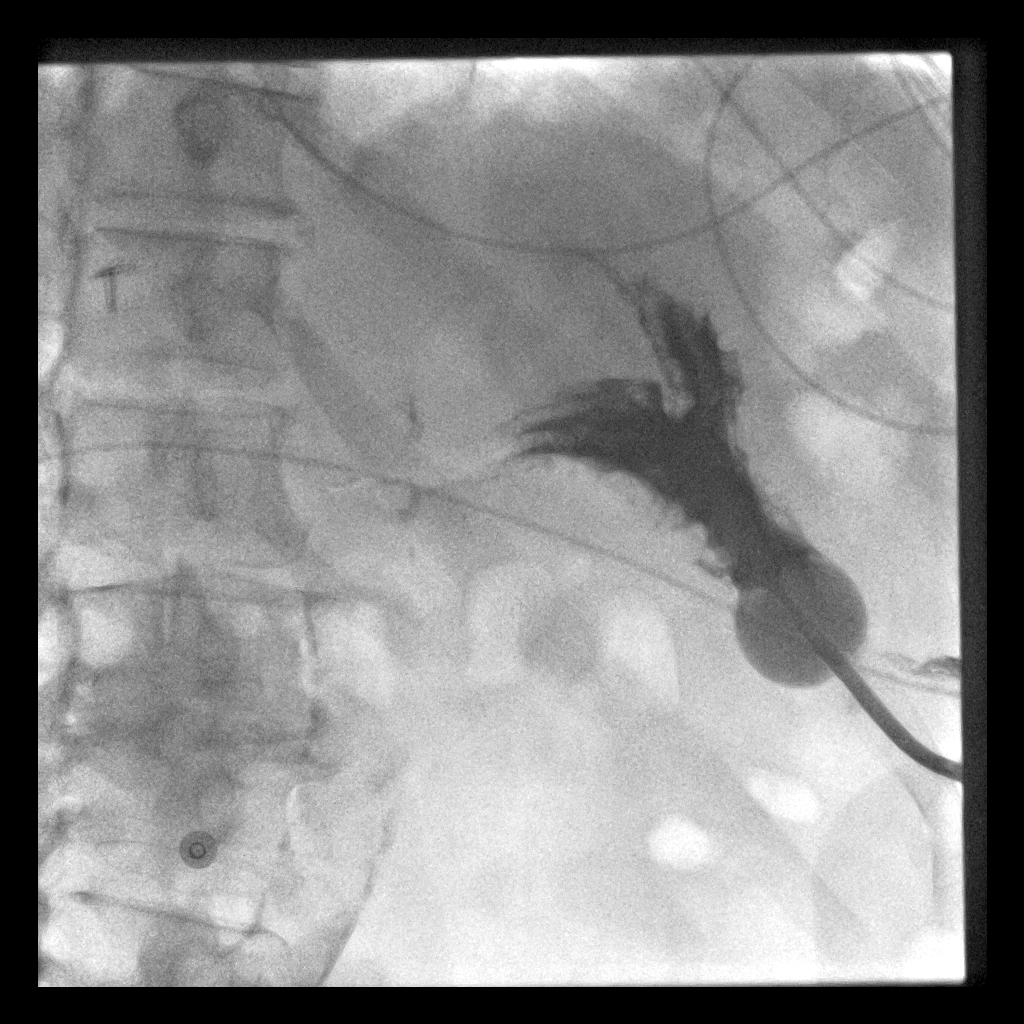
[im 3/3]
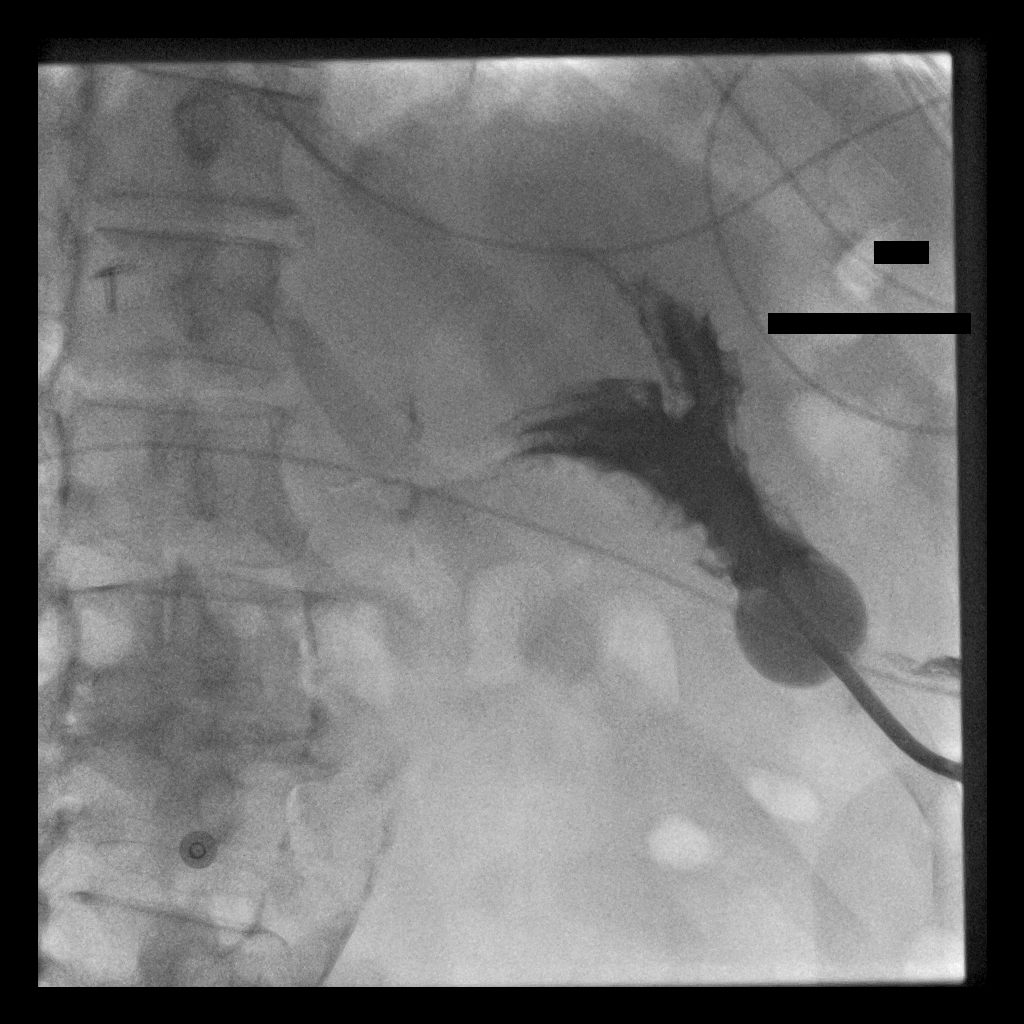

[3 of 3 positions shown; findings below may reference images not displayed]

MEDICATIONS:
None.

CONTRAST:  15 cc [id number] - administered into the gastric lumen

FLUOROSCOPY TIME:  2 minutes 6 seconds (50 mGy)

COMPLICATIONS:
None immediate.

PROCEDURE:
The site of the prior gastrostomy tube was cannulated with [REDACTED] tree adapter with contrast injection demonstrating atretic
though patent gastrostomy track to the level of the stomach.

A Kumpe catheter was advanced through the gastrostomy track to the
level of the stomach and distal esophagus. Contrast injection
confirmed appropriate positioning.

Next, over a short Amplatz wire, a new, 18 French balloon inflatable
gastrostomy tube was inserted through the track. The balloon was
inflated and disc was cinched. Contrast was injected and several
spot fluoroscopic images were obtained in various obliquities to
confirm appropriate intraluminal positioning. A dressing was placed.
The patient tolerated the procedure well without immediate
postprocedural complication.
IMPRESSION: Successful fluoroscopic guided replacement of a new 18-French
gastrostomy tube. The new gastrostomy tube is ready for immediate
use.
